# Patient Record
Sex: Female | Born: 1945 | Race: Black or African American | Hispanic: No | Marital: Married | Smoking: Never smoker
Health system: Southern US, Community
[De-identification: ages and names within clinical notes are randomized; demographics above are authoritative.]

## PROBLEM LIST (undated history)

## (undated) DIAGNOSIS — I341 Nonrheumatic mitral (valve) prolapse: Secondary | ICD-10-CM

## (undated) DIAGNOSIS — M21372 Foot drop, left foot: Secondary | ICD-10-CM

## (undated) DIAGNOSIS — I471 Supraventricular tachycardia, unspecified: Secondary | ICD-10-CM

## (undated) DIAGNOSIS — E7800 Pure hypercholesterolemia, unspecified: Secondary | ICD-10-CM

## (undated) DIAGNOSIS — R200 Anesthesia of skin: Secondary | ICD-10-CM

## (undated) DIAGNOSIS — Z98811 Dental restoration status: Secondary | ICD-10-CM

## (undated) HISTORY — PX: THROAT SURGERY: SHX803

## (undated) HISTORY — PX: COLONOSCOPY: SHX174

## (undated) HISTORY — PX: HERNIA REPAIR: SHX51

## (undated) HISTORY — PX: BARTHOLIN GLAND CYST EXCISION: SHX565

---

## 1996-09-28 HISTORY — PX: UTERINE ARTERY EMBOLIZATION: SHX2629

## 2009-08-09 ENCOUNTER — Emergency Department (HOSPITAL_COMMUNITY): Admission: EM | Admit: 2009-08-09 | Discharge: 2009-08-10 | Payer: Self-pay | Admitting: Emergency Medicine

## 2009-08-11 ENCOUNTER — Emergency Department (HOSPITAL_COMMUNITY): Admission: EM | Admit: 2009-08-11 | Discharge: 2009-08-11 | Payer: Self-pay | Admitting: Emergency Medicine

## 2009-08-11 ENCOUNTER — Ambulatory Visit: Payer: Self-pay | Admitting: Vascular Surgery

## 2009-08-11 ENCOUNTER — Encounter (INDEPENDENT_AMBULATORY_CARE_PROVIDER_SITE_OTHER): Payer: Self-pay | Admitting: Emergency Medicine

## 2009-08-18 ENCOUNTER — Encounter: Admission: RE | Admit: 2009-08-18 | Discharge: 2009-08-18 | Payer: Self-pay | Admitting: Orthopedic Surgery

## 2009-10-28 ENCOUNTER — Encounter: Admission: RE | Admit: 2009-10-28 | Discharge: 2010-01-26 | Payer: Self-pay | Admitting: Orthopedic Surgery

## 2010-01-13 ENCOUNTER — Encounter: Admission: RE | Admit: 2010-01-13 | Discharge: 2010-04-13 | Payer: Self-pay | Admitting: Orthopaedic Surgery

## 2010-04-30 ENCOUNTER — Encounter: Admission: RE | Admit: 2010-04-30 | Discharge: 2010-06-14 | Payer: Self-pay | Admitting: Orthopaedic Surgery

## 2010-05-16 IMAGING — CR DG KNEE 1-2V*L*
2 series · 2 of 2 positions shown · non-contrast
Comparison: 08/10/2009

CLINICAL DATA: Previous dislocation.  Swelling.

LEFT KNEE - 1-2 VIEW

[view not recorded (1 of 2)]
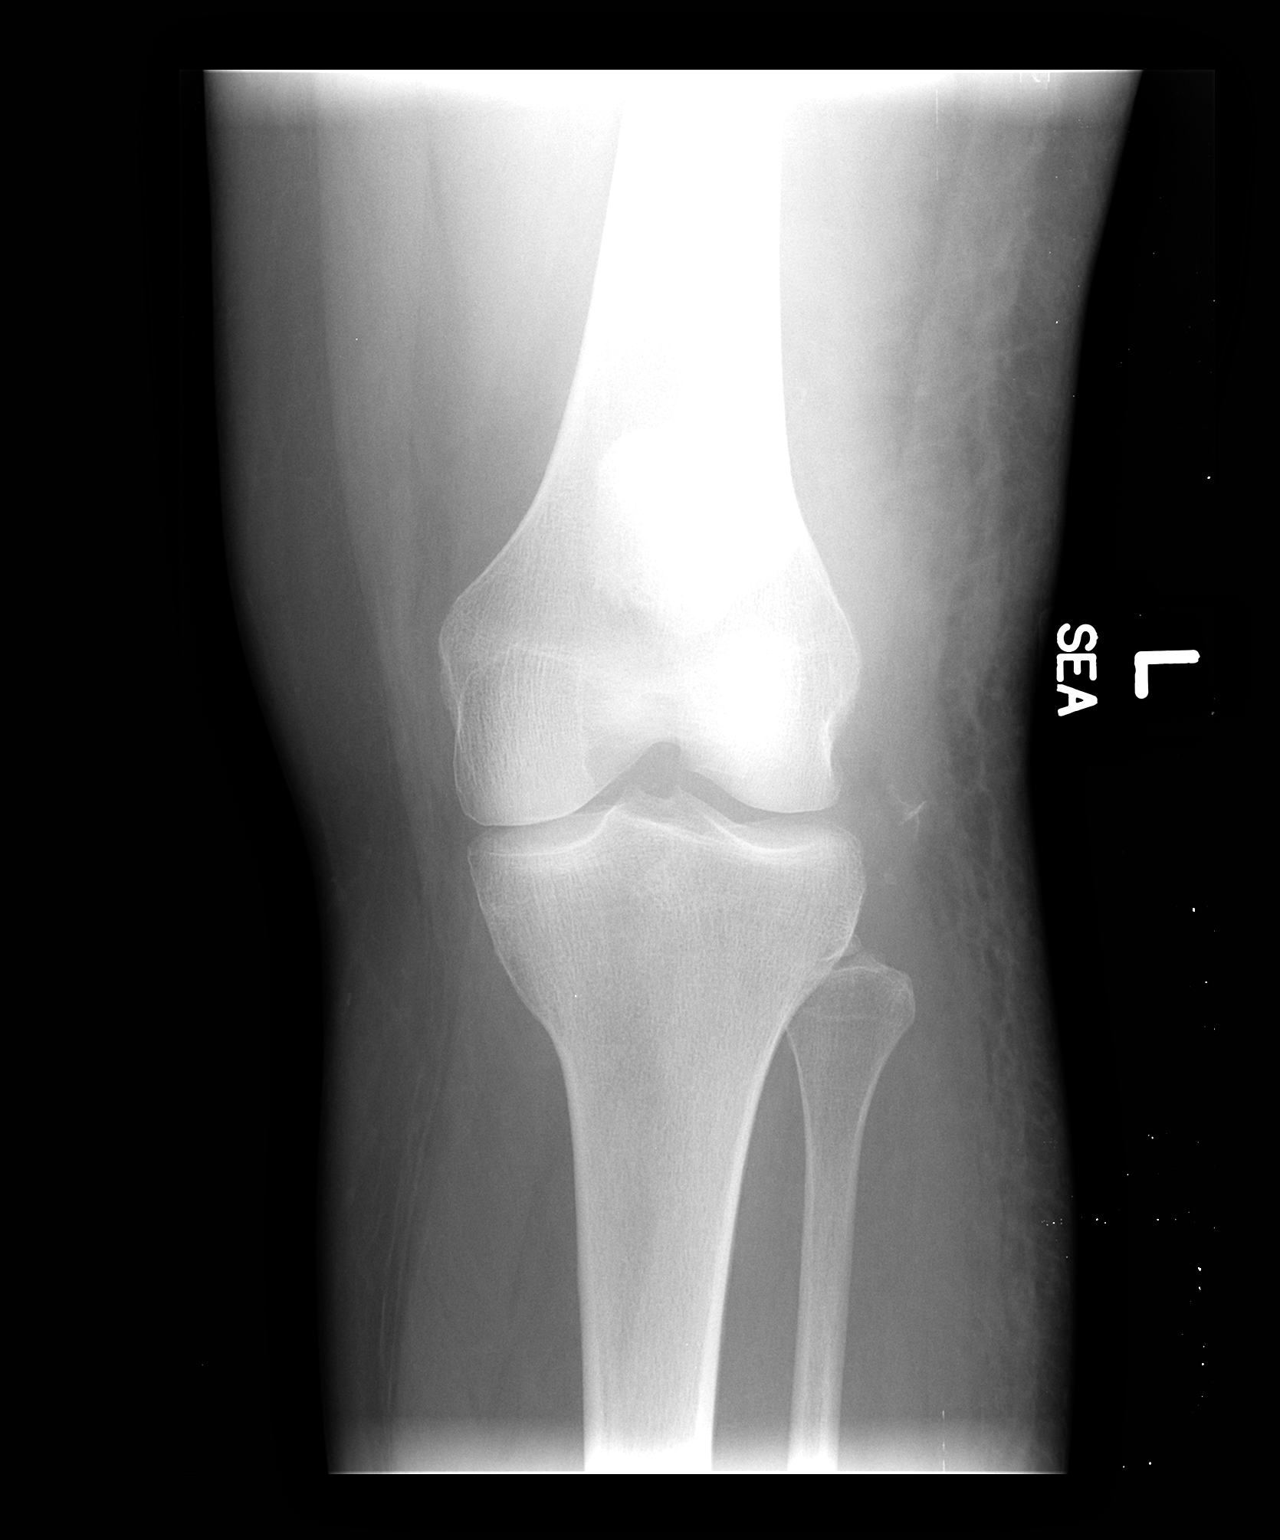

[view not recorded (2 of 2)]
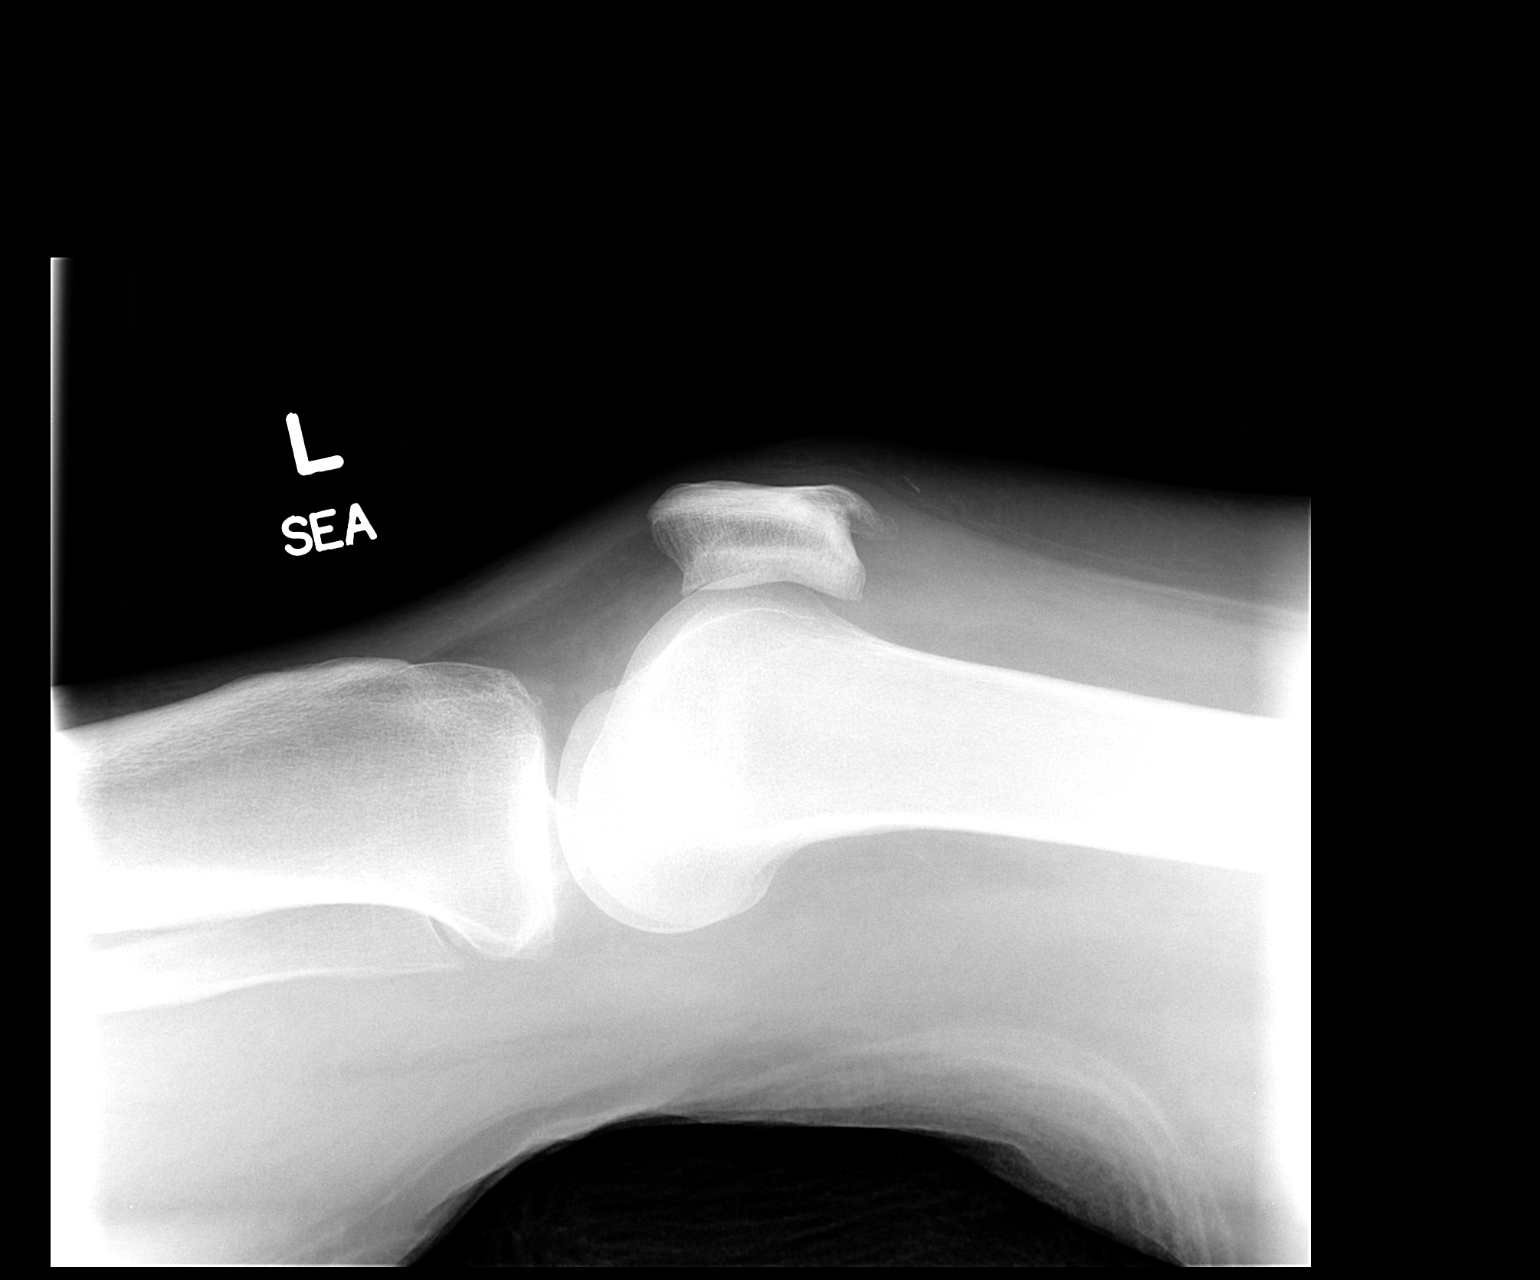

[2 of 2 positions shown; findings below may reference images not displayed]

FINDINGS: There is normal alignment.  No effusion.  Patellar spur
at the insertion of the quadriceps tendon.  Curvilinear small
avulsion fragments project in the soft tissues lateral to the knee
as before.  No new fracture.  Normal mineralization.
IMPRESSION: 1.  Stable appearance since previous films.

## 2010-12-31 LAB — DIFFERENTIAL
Lymphocytes Relative: 36 % (ref 12–46)
Lymphs Abs: 1.9 10*3/uL (ref 0.7–4.0)
Monocytes Absolute: 0.4 10*3/uL (ref 0.1–1.0)
Monocytes Relative: 7 % (ref 3–12)
Neutro Abs: 3 10*3/uL (ref 1.7–7.7)

## 2010-12-31 LAB — POCT I-STAT, CHEM 8
BUN: 9 mg/dL (ref 6–23)
Calcium, Ion: 1.02 mmol/L — ABNORMAL LOW (ref 1.12–1.32)
Chloride: 106 mEq/L (ref 96–112)
Creatinine, Ser: 0.9 mg/dL (ref 0.4–1.2)
Glucose, Bld: 180 mg/dL — ABNORMAL HIGH (ref 70–99)

## 2010-12-31 LAB — CBC
HCT: 36.8 % (ref 36.0–46.0)
Hemoglobin: 12.6 g/dL (ref 12.0–15.0)
RBC: 4.03 MIL/uL (ref 3.87–5.11)

## 2011-07-16 ENCOUNTER — Ambulatory Visit
Payer: Managed Care, Other (non HMO) | Attending: Orthopaedic Surgery | Admitting: Rehabilitative and Restorative Service Providers"

## 2011-07-16 DIAGNOSIS — M25579 Pain in unspecified ankle and joints of unspecified foot: Secondary | ICD-10-CM | POA: Insufficient documentation

## 2011-07-16 DIAGNOSIS — M6281 Muscle weakness (generalized): Secondary | ICD-10-CM | POA: Insufficient documentation

## 2011-07-16 DIAGNOSIS — IMO0001 Reserved for inherently not codable concepts without codable children: Secondary | ICD-10-CM | POA: Insufficient documentation

## 2011-07-22 ENCOUNTER — Ambulatory Visit: Payer: Managed Care, Other (non HMO) | Admitting: Physical Therapy

## 2011-07-27 ENCOUNTER — Encounter: Payer: Self-pay | Admitting: Rehabilitation

## 2011-07-29 ENCOUNTER — Encounter: Payer: Self-pay | Admitting: Physical Therapy

## 2011-07-31 ENCOUNTER — Encounter: Payer: Self-pay | Admitting: Physical Therapy

## 2011-08-03 ENCOUNTER — Encounter: Payer: Self-pay | Admitting: Physical Therapy

## 2011-08-05 ENCOUNTER — Ambulatory Visit: Payer: Managed Care, Other (non HMO) | Attending: Orthopaedic Surgery | Admitting: Physical Therapy

## 2011-08-05 DIAGNOSIS — M6281 Muscle weakness (generalized): Secondary | ICD-10-CM | POA: Insufficient documentation

## 2011-08-05 DIAGNOSIS — IMO0001 Reserved for inherently not codable concepts without codable children: Secondary | ICD-10-CM | POA: Insufficient documentation

## 2011-08-05 DIAGNOSIS — M25579 Pain in unspecified ankle and joints of unspecified foot: Secondary | ICD-10-CM | POA: Insufficient documentation

## 2011-08-07 ENCOUNTER — Encounter: Payer: Self-pay | Admitting: Physical Therapy

## 2011-08-18 ENCOUNTER — Ambulatory Visit: Payer: Managed Care, Other (non HMO) | Admitting: Rehabilitation

## 2011-08-26 ENCOUNTER — Ambulatory Visit: Payer: Managed Care, Other (non HMO) | Admitting: Physical Therapy

## 2011-09-02 ENCOUNTER — Ambulatory Visit: Payer: Managed Care, Other (non HMO) | Attending: Orthopaedic Surgery | Admitting: Physical Therapy

## 2011-09-02 DIAGNOSIS — IMO0001 Reserved for inherently not codable concepts without codable children: Secondary | ICD-10-CM | POA: Insufficient documentation

## 2011-09-02 DIAGNOSIS — M6281 Muscle weakness (generalized): Secondary | ICD-10-CM | POA: Insufficient documentation

## 2011-09-02 DIAGNOSIS — M25579 Pain in unspecified ankle and joints of unspecified foot: Secondary | ICD-10-CM | POA: Insufficient documentation

## 2011-09-09 ENCOUNTER — Ambulatory Visit: Payer: Managed Care, Other (non HMO) | Admitting: Rehabilitation

## 2011-09-28 DIAGNOSIS — E782 Mixed hyperlipidemia: Secondary | ICD-10-CM | POA: Insufficient documentation

## 2012-02-02 DIAGNOSIS — R29818 Other symptoms and signs involving the nervous system: Secondary | ICD-10-CM | POA: Insufficient documentation

## 2012-04-13 ENCOUNTER — Ambulatory Visit: Payer: BC Managed Care – PPO | Attending: Orthopaedic Surgery

## 2012-04-13 ENCOUNTER — Ambulatory Visit: Payer: BC Managed Care – PPO | Admitting: Rehabilitative and Restorative Service Providers"

## 2012-04-13 DIAGNOSIS — M25676 Stiffness of unspecified foot, not elsewhere classified: Secondary | ICD-10-CM | POA: Insufficient documentation

## 2012-04-13 DIAGNOSIS — IMO0001 Reserved for inherently not codable concepts without codable children: Secondary | ICD-10-CM | POA: Insufficient documentation

## 2012-04-13 DIAGNOSIS — M25673 Stiffness of unspecified ankle, not elsewhere classified: Secondary | ICD-10-CM | POA: Insufficient documentation

## 2012-04-13 DIAGNOSIS — M6281 Muscle weakness (generalized): Secondary | ICD-10-CM | POA: Insufficient documentation

## 2012-04-20 ENCOUNTER — Ambulatory Visit: Payer: BC Managed Care – PPO

## 2012-04-27 ENCOUNTER — Ambulatory Visit: Payer: BC Managed Care – PPO | Admitting: Physical Therapy

## 2012-05-04 ENCOUNTER — Ambulatory Visit: Payer: BC Managed Care – PPO | Attending: Orthopaedic Surgery | Admitting: Physical Therapy

## 2012-05-04 DIAGNOSIS — M6281 Muscle weakness (generalized): Secondary | ICD-10-CM | POA: Insufficient documentation

## 2012-05-04 DIAGNOSIS — M25676 Stiffness of unspecified foot, not elsewhere classified: Secondary | ICD-10-CM | POA: Insufficient documentation

## 2012-05-04 DIAGNOSIS — M25673 Stiffness of unspecified ankle, not elsewhere classified: Secondary | ICD-10-CM | POA: Insufficient documentation

## 2012-05-04 DIAGNOSIS — IMO0001 Reserved for inherently not codable concepts without codable children: Secondary | ICD-10-CM | POA: Insufficient documentation

## 2012-05-11 ENCOUNTER — Ambulatory Visit: Payer: BC Managed Care – PPO | Admitting: Physical Therapy

## 2012-05-18 ENCOUNTER — Ambulatory Visit: Payer: BC Managed Care – PPO | Admitting: Physical Therapy

## 2012-05-25 ENCOUNTER — Ambulatory Visit: Payer: BC Managed Care – PPO | Admitting: Physical Therapy

## 2012-06-01 ENCOUNTER — Ambulatory Visit: Payer: BC Managed Care – PPO | Attending: Orthopaedic Surgery | Admitting: Physical Therapy

## 2012-06-01 DIAGNOSIS — IMO0001 Reserved for inherently not codable concepts without codable children: Secondary | ICD-10-CM | POA: Insufficient documentation

## 2012-06-01 DIAGNOSIS — M25676 Stiffness of unspecified foot, not elsewhere classified: Secondary | ICD-10-CM | POA: Insufficient documentation

## 2012-06-01 DIAGNOSIS — M6281 Muscle weakness (generalized): Secondary | ICD-10-CM | POA: Insufficient documentation

## 2012-06-01 DIAGNOSIS — M25673 Stiffness of unspecified ankle, not elsewhere classified: Secondary | ICD-10-CM | POA: Insufficient documentation

## 2012-06-08 ENCOUNTER — Ambulatory Visit: Payer: BC Managed Care – PPO | Admitting: Physical Therapy

## 2013-01-11 DIAGNOSIS — N281 Cyst of kidney, acquired: Secondary | ICD-10-CM | POA: Insufficient documentation

## 2013-02-07 DIAGNOSIS — I471 Supraventricular tachycardia: Secondary | ICD-10-CM | POA: Insufficient documentation

## 2013-07-05 DIAGNOSIS — Z9889 Other specified postprocedural states: Secondary | ICD-10-CM

## 2013-07-05 DIAGNOSIS — Z8719 Personal history of other diseases of the digestive system: Secondary | ICD-10-CM | POA: Insufficient documentation

## 2015-05-02 DIAGNOSIS — I341 Nonrheumatic mitral (valve) prolapse: Secondary | ICD-10-CM | POA: Insufficient documentation

## 2015-10-30 ENCOUNTER — Encounter: Payer: Self-pay | Admitting: *Deleted

## 2015-11-04 NOTE — Discharge Instructions (Signed)
INSTRUCTIONS FOLLOWING OCULOPLASTIC SURGERY °AMY M. FOWLER, MD ° °AFTER YOUR EYE SURGERY, THER ARE MANY THINGS THWIHC YOU, THE PATIENT, CAN DO TO ASSURE THE BEST POSSIBLE RESULT FROM YOUR OPERATION.  THIS SHEET SHOULD BE REFERRED TO WHENEVER QUESTIONS ARISE.  IF THERE ARE ANY QUESTIONS NOT ANSWERED HERE, DO NOT HESITATE TO CALL OUR OFFICE AT [phone] OR [phone].  THERE IS ALWAYS [facility] AVAILABLE TO CALL IF QUESTIONS OR PROBLEMS ARISE. ° °VISION: Your vision may be blurred and out of focus after surgery until you are able to stop using your ointment, swelling resolves and your eye(s) heal. This may take 1 to 2 weeks at the least.  If your vision becomes gradually more dim or dark, this is not normal and you need to call our office immediately. ° °EYE CARE: For the first 48 hours after surgery, use ice packs frequently - “20 minutes on, 20 minutes off” - to help reduce swelling and bruising.  Small bags of frozen peas or corn make good ice packs along with cloths soaked in ice water.  If you are wearing a patch or other type of dressing following surgery, keep this on for the amount of time specified by your doctor.  For the first week following surgery, you will need to treat your stitches with great care.  If is OK to shower, but take care to not allow soapy water to run into your eye(s) to help reduce changes of infection.  You may gently clean the eyelashes and around the eye(s) with cotton balls and sterile water, BUT DO NOT RUB THE STITCHES VIGOROUSLY.  Keeping your stitches moist with ointment will help promote healing with minimal scar formation. ° °ACTIVITY: When you leave the surgery center, you should go home, rest and be inactive.  The eye(s) may feel scratchy and keeping the eyes closed will allow for faster healing.  The first week following surgery, avoid straining (anything making the face turn red) or lifting over 20 pounds.  Additionally, avoid bending which causes your head to go below  your waist.  Using your eyes will NOT harm them, so feel free to read, watch television, use the computer, etc as desired.  Driving depends on each individual, so check with your doctor if you have questions about driving. ° °MEDICATIONS:  You will be given a prescription for an ointment to use 4 times a day on your stitches.  You can use the ointment in your eyes if they feel scratchy or irritated.  If you eyelid(s) don’t close completely when you sleep, put some ointment in your eyes before bedtime. ° °EMERGENCY: If you experience SEVERE EYE PAIN OR HEADACHE UNRELIEVED BY TYLENOL OR PERCOCET, NAUSEA OR VOMITING, WORSENING REDNESS, OR WORSENING VISION (ESPECIALLY VISION THAT WA INITIALLY BETTER) CALL [phone] OR [phone] DURING BUSINESS HOURS OR AFTER HOURS. ° °General Anesthesia, Adult, Care After °Refer to this sheet in the next few weeks. These instructions provide you with information on caring for yourself after your procedure. Your health care provider may also give you more specific instructions. Your treatment has been planned according to current medical practices, but problems sometimes occur. Call your health care provider if you have any problems or questions after your procedure. °WHAT TO EXPECT AFTER THE PROCEDURE °After the procedure, it is typical to experience: °· Sleepiness. °· Nausea and vomiting. °HOME CARE INSTRUCTIONS °· For the first 24 hours after general anesthesia: °¨ Have a responsible person with you. °¨ Do not drive a car. If you   are alone, do not take public transportation. °¨ Do not drink alcohol. °¨ Do not take medicine that has not been prescribed by your health care provider. °¨ Do not sign important papers or make important decisions. °¨ You may resume a normal diet and activities as directed by your health care provider. °· Change bandages (dressings) as directed. °· If you have questions or problems that seem related to general anesthesia, call the hospital and ask for  the anesthetist or anesthesiologist on call. °SEEK MEDICAL CARE IF: °· You have nausea and vomiting that continue the day after anesthesia. °· You develop a rash. °SEEK IMMEDIATE MEDICAL CARE IF:  °· You have difficulty breathing. °· You have chest pain. °· You have any allergic problems. °  °This information is not intended to replace advice given to you by your health care provider. Make sure you discuss any questions you have with your health care provider. °  °Document Released: 12/21/2000 Document Revised: 10/05/2014 Document Reviewed: 01/13/2012 °Elsevier Interactive Patient Education ©2016 [employer] ° °

## 2015-11-05 ENCOUNTER — Ambulatory Visit: Payer: Medicare Other | Admitting: Anesthesiology

## 2015-11-05 ENCOUNTER — Ambulatory Visit
Admission: RE | Admit: 2015-11-05 | Discharge: 2015-11-05 | Disposition: A | Discharge status: Home / Self Care | Payer: Medicare Other | Source: Ambulatory Visit | Attending: Ophthalmology | Admitting: Ophthalmology

## 2015-11-05 ENCOUNTER — Encounter
Admission: RE | Disposition: A | Discharge status: Home / Self Care | Payer: Self-pay | Source: Ambulatory Visit | Attending: Ophthalmology

## 2015-11-05 DIAGNOSIS — H0015 Chalazion left lower eyelid: Secondary | ICD-10-CM | POA: Diagnosis not present

## 2015-11-05 DIAGNOSIS — Z79899 Other long term (current) drug therapy: Secondary | ICD-10-CM | POA: Insufficient documentation

## 2015-11-05 DIAGNOSIS — H02403 Unspecified ptosis of bilateral eyelids: Secondary | ICD-10-CM | POA: Diagnosis not present

## 2015-11-05 DIAGNOSIS — M21372 Foot drop, left foot: Secondary | ICD-10-CM | POA: Diagnosis not present

## 2015-11-05 DIAGNOSIS — H02834 Dermatochalasis of left upper eyelid: Secondary | ICD-10-CM | POA: Insufficient documentation

## 2015-11-05 DIAGNOSIS — R002 Palpitations: Secondary | ICD-10-CM | POA: Insufficient documentation

## 2015-11-05 DIAGNOSIS — K449 Diaphragmatic hernia without obstruction or gangrene: Secondary | ICD-10-CM | POA: Insufficient documentation

## 2015-11-05 DIAGNOSIS — H02831 Dermatochalasis of right upper eyelid: Secondary | ICD-10-CM | POA: Diagnosis not present

## 2015-11-05 DIAGNOSIS — H02839 Dermatochalasis of unspecified eye, unspecified eyelid: Secondary | ICD-10-CM | POA: Diagnosis present

## 2015-11-05 HISTORY — DX: Nonrheumatic mitral (valve) prolapse: I34.1

## 2015-11-05 HISTORY — DX: Anesthesia of skin: R20.0

## 2015-11-05 HISTORY — PX: BROW LIFT: SHX178

## 2015-11-05 HISTORY — PX: CHALAZION EXCISION: SHX213

## 2015-11-05 HISTORY — PX: PTOSIS REPAIR: SHX6568

## 2015-11-05 HISTORY — DX: Pure hypercholesterolemia, unspecified: E78.00

## 2015-11-05 HISTORY — DX: Supraventricular tachycardia: I47.1

## 2015-11-05 HISTORY — DX: Foot drop, left foot: M21.372

## 2015-11-05 HISTORY — DX: Supraventricular tachycardia, unspecified: I47.10

## 2015-11-05 HISTORY — DX: Dental restoration status: Z98.811

## 2015-11-05 SURGERY — BLEPHAROPLASTY
Anesthesia: Monitor Anesthesia Care | Site: Face | Laterality: Bilateral | Wound class: Clean

## 2015-11-05 MED ORDER — PROPOFOL 500 MG/50ML IV EMUL
INTRAVENOUS | Status: DC | PRN
  Administered 2015-11-05: 25 ug/kg/min via INTRAVENOUS

## 2015-11-05 MED ORDER — LACTATED RINGERS IV SOLN: 500.0000 mL | INTRAVENOUS | Status: DC

## 2015-11-05 MED ORDER — OXYCODONE HCL 5 MG/5ML PO SOLN: 5.0000 mg | Freq: Once | ORAL | Status: DC | PRN

## 2015-11-05 MED ORDER — ACETAMINOPHEN 325 MG PO TABS: 325.0000 mg | ORAL_TABLET | ORAL | Status: DC | PRN

## 2015-11-05 MED ORDER — BACITRACIN 500 UNIT/GM OP OINT: TOPICAL_OINTMENT | OPHTHALMIC | Status: DC

## 2015-11-05 MED ORDER — DEXAMETHASONE SODIUM PHOSPHATE 4 MG/ML IJ SOLN: 8.0000 mg | Freq: Once | INTRAMUSCULAR | Status: DC | PRN

## 2015-11-05 MED ORDER — LACTATED RINGERS IV SOLN
INTRAVENOUS | Status: DC
  Administered 2015-11-05: 10:00:00 via INTRAVENOUS

## 2015-11-05 MED ORDER — ACETAMINOPHEN 160 MG/5ML PO SOLN: 325.0000 mg | ORAL | Status: DC | PRN

## 2015-11-05 MED ORDER — FENTANYL CITRATE (PF) 100 MCG/2ML IJ SOLN: 25.0000 ug | INTRAMUSCULAR | Status: DC | PRN

## 2015-11-05 MED ORDER — BUPIVACAINE HCL (PF) 0.5 % IJ SOLN
INTRAMUSCULAR | Status: DC | PRN
  Administered 2015-11-05: 7 mL via OPHTHALMIC

## 2015-11-05 MED ORDER — ALFENTANIL 500 MCG/ML IJ INJ
INJECTION | INTRAMUSCULAR | Status: DC | PRN
  Administered 2015-11-05: 600 ug via INTRAVENOUS
  Administered 2015-11-05 (×2): 200 ug via INTRAVENOUS

## 2015-11-05 MED ORDER — ERYTHROMYCIN 5 MG/GM OP OINT
TOPICAL_OINTMENT | OPHTHALMIC | Status: DC | PRN
  Administered 2015-11-05: 1 via OPHTHALMIC

## 2015-11-05 MED ORDER — OXYCODONE HCL 5 MG PO TABS: 5.0000 mg | ORAL_TABLET | Freq: Once | ORAL | Status: DC | PRN

## 2015-11-05 MED ORDER — NEOMYCIN-POLYMYXIN-DEXAMETH 3.5-10000-0.1 OP SUSP: OPHTHALMIC | Status: DC

## 2015-11-05 MED ORDER — OXYCODONE-ACETAMINOPHEN 5-325 MG PO TABS: 1.0000 | ORAL_TABLET | ORAL | Status: DC | PRN

## 2015-11-05 MED ORDER — TETRACAINE HCL 0.5 % OP SOLN
OPHTHALMIC | Status: DC | PRN
  Administered 2015-11-05: 2 [drp] via OPHTHALMIC

## 2015-11-05 MED ORDER — MIDAZOLAM HCL 2 MG/2ML IJ SOLN
INTRAMUSCULAR | Status: DC | PRN
  Administered 2015-11-05 (×2): 1 mg via INTRAVENOUS

## 2015-11-05 MED ORDER — BSS IO SOLN
INTRAOCULAR | Status: DC | PRN
  Administered 2015-11-05: 5 mL via INTRAOCULAR

## 2015-11-05 SURGICAL SUPPLY — 42 items
APPLICATOR COTTON TIP WD 3 STR (MISCELLANEOUS) ×6 IMPLANT
BANDAGE EYE OVAL (MISCELLANEOUS) ×3 IMPLANT
BLADE SURG 15 STRL LF DISP TIS (BLADE) ×1 IMPLANT
BLADE SURG 15 STRL SS (BLADE) ×2
BLADE SURG SZ11 CARB STEEL (BLADE) ×3 IMPLANT
CAUTERY HITEMP AA01 (MISCELLANEOUS) ×3 IMPLANT
CORD BIP STRL DISP 12FT (MISCELLANEOUS) ×3 IMPLANT
COVER MAYO STAND STRL (DRAPES) ×3 IMPLANT
DRAPE HEAD BAR (DRAPES) ×3 IMPLANT
DRAPE SHEET LG 3/4 BI-LAMINATE (DRAPES) ×3 IMPLANT
GAUZE SPONGE 4X4 12PLY STRL (GAUZE/BANDAGES/DRESSINGS) ×3 IMPLANT
GAUZE SPONGE NON-WVN 2X2 STRL (MISCELLANEOUS) ×10 IMPLANT
GLOVE SURG LX 7.0 MICRO (GLOVE) ×4
GLOVE SURG LX STRL 7.0 MICRO (GLOVE) ×2 IMPLANT
MARKER SKIN XFINE TIP W/RULER (MISCELLANEOUS) ×3 IMPLANT
NEEDLE FILTER BLUNT 18X 1/2SAF (NEEDLE) ×2
NEEDLE FILTER BLUNT 18X1 1/2 (NEEDLE) ×1 IMPLANT
NEEDLE HYPO 30X.5 LL (NEEDLE) ×6 IMPLANT
PACK DRAPE NASAL/ENT (PACKS) ×3 IMPLANT
SOL PREP PVP 2OZ (MISCELLANEOUS) ×3
SOLUTION PREP PVP 2OZ (MISCELLANEOUS) ×1 IMPLANT
SPONGE VERSALON 2X2 STRL (MISCELLANEOUS) ×20
SUT CHROMIC 4-0 (SUTURE)
SUT CHROMIC 4-0 M2 12X2 ARM (SUTURE)
SUT CHROMIC 5 0 P 3 (SUTURE) IMPLANT
SUT ETHILON 4 0 CL P 3 (SUTURE) IMPLANT
SUT MERSILENE 4-0 S-2 (SUTURE) IMPLANT
SUT PDS AB 4-0 P3 18 (SUTURE) IMPLANT
SUT PLAIN GUT (SUTURE) ×6 IMPLANT
SUT PROLENE 5 0 P 3 (SUTURE) ×3 IMPLANT
SUT PROLENE 6 0 P 1 18 (SUTURE) ×6 IMPLANT
SUT SILK 4 0 G 3 (SUTURE) ×3 IMPLANT
SUT VIC AB 5-0 P-3 18X BRD (SUTURE) IMPLANT
SUT VIC AB 5-0 P3 18 (SUTURE)
SUT VICRYL 6-0  S14 CTD (SUTURE)
SUT VICRYL 6-0 S14 CTD (SUTURE) IMPLANT
SUT VICRYL 7 0 TG140 8 (SUTURE) IMPLANT
SUTURE CHRMC 4-0 M2 12X2 ARM (SUTURE) IMPLANT
SYR 3ML LL SCALE MARK (SYRINGE) ×3 IMPLANT
SYRINGE 10CC LL (SYRINGE) ×3 IMPLANT
TOWEL OR 17X26 4PK STRL BLUE (TOWEL DISPOSABLE) ×3 IMPLANT
WATER STERILE IRR 500ML POUR (IV SOLUTION) ×3 IMPLANT

## 2015-11-05 NOTE — Anesthesia Procedure Notes (Signed)
Procedure Name: MAC Date/Time: 11/05/2015 10:51 AM Performed by: Cameron Ali Pre-anesthesia Checklist: Patient identified, Emergency Drugs available, Suction available, Timeout performed and Patient being monitored Patient Re-evaluated:Patient Re-evaluated prior to inductionOxygen Delivery Method: Nasal cannula Placement Confirmation: positive ETCO2

## 2015-11-05 NOTE — Anesthesia Postprocedure Evaluation (Signed)
Anesthesia Post Note  Patient: Amy Richards  Procedure(s) Performed: Procedure(s) (LRB): BLEPHAROPLASTY UPPER WITH EXCESS SKIN AND LOWER COSMETIC (Bilateral) EXCISION CHALAZION LEFT UPPER LID (Bilateral) PTOSIS REPAIR (Bilateral)  Patient location during evaluation: PACU Anesthesia Type: MAC Level of consciousness: awake and alert Pain management: pain level controlled Vital Signs Assessment: post-procedure vital signs reviewed and stable Respiratory status: spontaneous breathing, nonlabored ventilation and respiratory function stable Cardiovascular status: blood pressure returned to baseline and stable Postop Assessment: no signs of nausea or vomiting Anesthetic complications: no    [name] D [name]

## 2015-11-05 NOTE — Transfer of Care (Signed)
Immediate Anesthesia Transfer of Care Note  Patient: Amy Richards  Procedure(s) Performed: Procedure(s): BLEPHAROPLASTY UPPER WITH EXCESS SKIN AND LOWER COSMETIC (Bilateral) EXCISION CHALAZION LEFT UPPER LID (Bilateral) PTOSIS REPAIR (Bilateral)  Patient Location: PACU  Anesthesia Type: No value filed.  Level of Consciousness: awake, alert  and patient cooperative  Airway and Oxygen Therapy: Patient Spontanous Breathing and Patient connected to supplemental oxygen  Post-op Assessment: Post-op Vital signs reviewed, Patient's Cardiovascular Status Stable, Respiratory Function Stable, Patent Airway and No signs of Nausea or vomiting  Post-op Vital Signs: Reviewed and stable  Complications: No apparent anesthesia complications

## 2015-11-05 NOTE — Interval H&P Note (Signed)
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 After consideration of risks, benefits and other options for treatment, the patient has consented to  Procedure(s): BLEPHAROPLASTY UPPER WITH EXCESS SKIN AND LOWER COSMETIC (Bilateral) EXCISION CHALAZION LEFT UPPER LID (Bilateral) PTOSIS REPAIR (Bilateral) as a surgical intervention .  The patient's history has been reviewed, patient examined, no change in status, stable for surgery.  I have reviewed the patient's chart and labs.  Questions were answered to the patient's satisfaction.     Vickki Muff, Tabatha Razzano M

## 2015-11-05 NOTE — H&P (Signed)
  See the history and physical completed at [facility] on 10/23/2015 and scanned into the chart

## 2015-11-05 NOTE — Anesthesia Preprocedure Evaluation (Signed)
Anesthesia Evaluation  Patient identified by MRN, date of birth, ID band Patient awake    Reviewed: Allergy & Precautions, H&P , NPO status , Patient's Chart, lab work & pertinent test results, reviewed documented beta blocker date and time   Airway Mallampati: II  TM Distance: >3 FB Neck ROM: full    Dental no notable dental hx.    Pulmonary neg pulmonary ROS,    Pulmonary exam normal breath sounds clear to auscultation       Cardiovascular Exercise Tolerance: Good + dysrhythmias Supra Ventricular Tachycardia  Rhythm:regular Rate:Normal     Neuro/Psych negative neurological ROS  negative psych ROS   GI/Hepatic negative GI ROS, Neg liver ROS,   Endo/Other  negative endocrine ROS  Renal/GU negative Renal ROS  negative genitourinary   Musculoskeletal   Abdominal   Peds  Hematology negative hematology ROS (+)   Anesthesia Other Findings   Reproductive/Obstetrics negative OB ROS                             Anesthesia Physical Anesthesia Plan  ASA: II  Anesthesia Plan: MAC   Post-op Pain Management:    Induction:   Airway Management Planned:   Additional Equipment:   Intra-op Plan:   Post-operative Plan:   Informed Consent: I have reviewed the patients History and Physical, chart, labs and discussed the procedure including the risks, benefits and alternatives for the proposed anesthesia with the patient or authorized representative who has indicated his/her understanding and acceptance.     Plan Discussed with: CRNA  Anesthesia Plan Comments:         Anesthesia Quick Evaluation

## 2015-11-05 NOTE — Op Note (Signed)
Preoperative Diagnosis:  1. Visually significant dermatochalasis both Upper Eyelid(s) 2. Visually significant blepharoptosis both upper eyelids 3.  Steatoblephron both Lower Eyelid(s) 4.  Chalazion left upper eyelid   Postoperative Diagnosis:  Same.  Procedure(s) Performed:   1. Upper eyelid blepharoplasty with excess skin excision  both Upper Eyelid(s) 2. Blepharoptosis repair with levator aponeurosis advancement both eyelids 3.  Cosmetic transconjunctival lower eyelid blepharoplasty both  Lower Eyelid(s) 4. Chalazion excision left upper eyelid   Teaching Surgeon: Philis Pique. Vickki Muff, M.D.  Assistants: none  Anesthesia: IVA  Specimens: None.  Estimated Blood Loss: Minimal.  Complications: None.  Operative Findings: None Dictated  Procedure:   After the risks, benefits, complications and alternatives were discussed with the patient, appropriate informed consent was obtained and the patient was brought to the operating suite. The patient was reclined supine and a timeout was conducted.  Local anesthetic consisting of a 50-50 mixture of 2% lidocaine with epinephrine and 0.75% bupivacaine with added Hylenex was injected subcutaneously to both upper and lower eyelid(s). Additional anesthetic was injected subconjunctivally to both lower eyelids.  After adequate local was instilled, the patient was prepped and draped in the usual sterile fashion for eyelid surgery.   Attention was turned to the upper eyelids. A 40mm upper eyelid crease incision line was marked with calipers on both upper eyelid(s).  A pinch test was used to estimate the amount of excess skin to remove and this was marked in standard blepharoplasty style fashion. Attention was turned to the  right upper eyelid. A #15 blade was used to open the premarked incision line. A skin only flap  was excised and hemostasis was obtained with bipolar cautery.   A buttonhole was created medially and reticularis orbital septum to reveal the  medial fat pockets. These were dissected free from fascial attachments, allowed to prolapse anteriorly, cauterized towards the pedicle base and excised.   Westcott scissors were then used to transect through orbicularis down to the tarsal plate. Epitarsus was dissected to create a smooth surface to suture to. Dissection was then carried superiorly in the plane between orbicularis and orbital septum. Once the preaponeurotic fat pocket was identified, the orbital septum was opened. This revealed the levator and its aponeurosis.    Attention was then turned to the opposite eyelid where the same procedure was performed in the same manner. Hemostasis was obtained with bipolar cautery throughout.   3 interrupted 6-0 Prolene sutures were then passed partial thickness through the tarsal plates both upper eyelid(s). These sutures were placed in line with the mid pupillary, medial limbal, and lateral limbal lines. The sutures were fixed to the levator aponeurosis and adjusted until a nice lid height and contour were achieved. All incisions were then closed with a combination of running and interrupted 6-0 fast absorbing plain suture.   Attention was turned to the right lower eyelid.  A 4.0 silk traction suture was placed through the lid margin and the lid was everted over a desmarres retractor.  A #15 blade was used to make an incision through conjunctiva along the inferior margin of the tarsus.  Hemostasis was obtained with bipolar cautery.  Westcott scissors were used to dis-insert the retractors from the tarsus and both conjunctiva and retractors were allowed to recess inferiorly.   Stephens scissors were used to bluntly dissect between orbicularis and orbital septum down to the inferior orbital rim.  The orbital septum was opened over the medial, central, and lateral fat pockets. These were allowed to prolapse anteriorly,  cauterized towards the pedicle base, and excised. This provided nice flattening of the lower  lid contour.  Attention was then turned to the opposite lid where the same procedure was performed in the same fashion.  The patient tolerated the procedure well.  Erythromycin ophthalmicment was applied to Her incision sites, followed by ice packs. She was taken to the recovery area where she recovered without difficulty.  Post-Op Plan/Instructions:  The patient was instructed to use ice packs frequently for the next 48 hours. She was instructed to use  last tracing ophthalmic  ointment on her incisions 4 times a day  and Maxitrol drops 3 times a day in both eyes for the next 12 to 14 days. She was given a prescription for Percocet for pain control should Tylenol not be effective. She was asked to to follow up in 2 weeks' time or sooner as needed for problems.  Teaching Surgeon Attestation: none  Samiyyah Moffa M. Vickki Muff, M.D.

## 2015-11-06 ENCOUNTER — Encounter: Payer: Self-pay | Admitting: Ophthalmology

## 2015-11-20 DIAGNOSIS — Z800 Family history of malignant neoplasm of digestive organs: Secondary | ICD-10-CM | POA: Insufficient documentation

## 2017-07-09 DIAGNOSIS — R7301 Impaired fasting glucose: Secondary | ICD-10-CM | POA: Insufficient documentation

## 2018-11-10 DIAGNOSIS — M25561 Pain in right knee: Secondary | ICD-10-CM | POA: Insufficient documentation

## 2018-11-10 DIAGNOSIS — M25562 Pain in left knee: Secondary | ICD-10-CM | POA: Insufficient documentation

## 2018-11-14 ENCOUNTER — Ambulatory Visit: Payer: Medicare HMO | Admitting: Podiatry

## 2018-11-14 ENCOUNTER — Other Ambulatory Visit: Payer: Self-pay

## 2018-11-14 VITALS — BP 137/78 | HR 58

## 2018-11-14 DIAGNOSIS — M21372 Foot drop, left foot: Secondary | ICD-10-CM

## 2018-11-14 DIAGNOSIS — M79674 Pain in right toe(s): Secondary | ICD-10-CM

## 2018-11-14 DIAGNOSIS — B351 Tinea unguium: Secondary | ICD-10-CM

## 2018-11-14 DIAGNOSIS — G5792 Unspecified mononeuropathy of left lower limb: Secondary | ICD-10-CM

## 2018-11-14 DIAGNOSIS — L84 Corns and callosities: Secondary | ICD-10-CM

## 2018-11-14 DIAGNOSIS — M79675 Pain in left toe(s): Secondary | ICD-10-CM | POA: Diagnosis not present

## 2018-11-14 NOTE — Patient Instructions (Signed)
Onychomycosis/Fungal Toenails  WHAT IS IT? An infection that lies within the keratin of your nail plate that is caused by a fungus.  WHY ME? Fungal infections affect all ages, sexes, races, and creeds.  There may be many factors that predispose you to a fungal infection such as age, coexisting medical conditions such as diabetes, or an autoimmune disease; stress, medications, fatigue, genetics, etc.  Bottom line: fungus thrives in a warm, moist environment and your shoes offer such a location.  IS IT CONTAGIOUS? Theoretically, yes.  You do not want to share shoes, nail clippers or files with someone who has fungal toenails.  Walking around barefoot in the same room or sleeping in the same bed is unlikely to transfer the organism.  It is important to realize, however, that fungus can spread easily from one nail to the next on the same foot.  HOW DO WE TREAT THIS?  There are several ways to treat this condition.  Treatment may depend on many factors such as age, medications, pregnancy, liver and kidney conditions, etc.  It is best to ask your doctor which options are available to you.  1. No treatment.   Unlike many other medical concerns, you can live with this condition.  However for many people this can be a painful condition and may lead to ingrown toenails or a bacterial infection.  It is recommended that you keep the nails cut short to help reduce the amount of fungal nail. 2. Topical treatment.  These range from herbal remedies to prescription strength nail lacquers.  About 40-50% effective, topicals require twice daily application for approximately 9 to 12 months or until an entirely new nail has grown out.  The most effective topicals are medical grade medications available through physicians offices. 3. Oral antifungal medications.  With an 80-90% cure rate, the most common oral medication requires 3 to 4 months of therapy and stays in your system for a year as the new nail grows out.  Oral  antifungal medications do require blood work to make sure it is a safe drug for you.  A liver function panel will be performed prior to starting the medication and after the first month of treatment.  It is important to have the blood work performed to avoid any harmful side effects.  In general, this medication safe but blood work is required. 4. Laser Therapy.  This treatment is performed by applying a specialized laser to the affected nail plate.  This therapy is noninvasive, fast, and non-painful.  It is not covered by insurance and is therefore, out of pocket.  The results have been very good with a 80-95% cure rate.  [facility] is the only practice in the area to offer this therapy. Permanent Nail Avulsion.  Removing the entire nail so that a new nail will not grow back.Corns and Calluses Corns are small areas of thickened skin that occur on the top, sides, or tip of a toe. They contain a cone-shaped core with a point that can press on a nerve below. This causes pain.  Calluses are areas of thickened skin that can occur anywhere on the body, including the hands, fingers, palms, soles of the feet, and heels. Calluses are usually larger than corns. What are the causes? Corns and calluses are caused by rubbing (friction) or pressure, such as from shoes that are too tight or do not fit properly. What increases the risk? Corns are more likely to develop in people who have misshapen   toes (toe deformities), such as hammer toes. Calluses can occur with friction to any area of the skin. They are more likely to develop in people who:  Work with their hands.  Wear shoes that fit poorly, are too tight, or are high-heeled.  Have toe deformities. What are the signs or symptoms? Symptoms of a corn or callus include:  A hard growth on the skin.  Pain or tenderness under the skin.  Redness and swelling.  Increased discomfort while wearing tight-fitting shoes, if your feet are affected. If a  corn or callus becomes infected, symptoms may include:  Redness and swelling that gets worse.  Pain.  Fluid, blood, or pus draining from the corn or callus. How is this diagnosed? Corns and calluses may be diagnosed based on your symptoms, your medical history, and a physical exam. How is this treated? Treatment for corns and calluses may include:  Removing the cause of the friction or pressure. This may involve: ? Changing your shoes. ? Wearing shoe inserts (orthotics) or other protective layers in your shoes, such as a corn pad. ? Wearing gloves.  Applying medicine to the skin (topical medicine) to help soften skin in the hardened, thickened areas.  Removing layers of dead skin with a file to reduce the size of the corn or callus.  Removing the corn or callus with a scalpel or laser.  Taking antibiotic medicines, if your corn or callus is infected.  Having surgery, if a toe deformity is the cause. Follow these instructions at home:   Take over-the-counter and prescription medicines only as told by your health care provider.  If you were prescribed an antibiotic, take it as told by your health care provider. Do not stop taking it even if your condition starts to improve.  Wear shoes that fit well. Avoid wearing high-heeled shoes and shoes that are too tight or too loose.  Wear any padding, protective layers, gloves, or orthotics as told by your health care provider.  Soak your hands or feet and then use a file or pumice stone to soften your corn or callus. Do this as told by your health care provider.  Check your corn or callus every day for symptoms of infection. Contact a health care provider if you:  Notice that your symptoms do not improve with treatment.  Have redness or swelling that gets worse.  Notice that your corn or callus becomes painful.  Have fluid, blood, or pus coming from your corn or callus.  Have new symptoms. Summary  Corns are small areas of  thickened skin that occur on the top, sides, or tip of a toe.  Calluses are areas of thickened skin that can occur anywhere on the body, including the hands, fingers, palms, and soles of the feet. Calluses are usually larger than corns.  Corns and calluses are caused by rubbing (friction) or pressure, such as from shoes that are too tight or do not fit properly.  Treatment may include wearing any padding, protective layers, gloves, or orthotics as told by your health care provider. This information is not intended to replace advice given to you by your health care provider. Make sure you discuss any questions you have with your health care provider. Document Released: 06/20/2004 Document Revised: 07/28/2017 Document Reviewed: 07/28/2017 Elsevier Interactive Patient Education  2019 [employer]  

## 2018-11-17 DIAGNOSIS — M79672 Pain in left foot: Secondary | ICD-10-CM | POA: Insufficient documentation

## 2018-11-17 DIAGNOSIS — M179 Osteoarthritis of knee, unspecified: Secondary | ICD-10-CM | POA: Insufficient documentation

## 2018-11-17 DIAGNOSIS — M1712 Unilateral primary osteoarthritis, left knee: Secondary | ICD-10-CM | POA: Insufficient documentation

## 2018-11-17 DIAGNOSIS — M1710 Unilateral primary osteoarthritis, unspecified knee: Secondary | ICD-10-CM | POA: Insufficient documentation

## 2018-11-21 ENCOUNTER — Encounter: Payer: Self-pay | Admitting: Podiatry

## 2018-11-21 NOTE — Progress Notes (Signed)
Subjective: Amy Richards presents today referred by Bartholome Bill, MD with cc of painful, discolored, thick toenails and calluses for the past few months which interfere with daily activities.  Duration is longstanding.Pain is aggravated when wearing enclosed shoe gear. Pain is relieved with periodic professional debridement. She has had routine professional care in the past.   Patient states she has drop foot secondary to fall down a flight of stairs in 2010. She relates this happened years ago and she has numbness/weakness left foot. She wears brace at night for this, but no AFO daily.  Past Medical History:  Diagnosis Date  . Dental crowns present    right upper  . Foot drop, left    wears brace at night  . Hypercholesteremia   . Left leg numbness    s/p accident 2010, numb from knee to foot  . MVP (mitral valve prolapse)    palpatations  . PSVT (paroxysmal supraventricular tachycardia) [facility])     Patient Active Problem List   Diagnosis Date Noted  . Impaired fasting glucose 07/09/2017  . Family history of colon cancer 11/20/2015  . Mitral valve prolapse 05/02/2015  . S/P repair of ventral hernia 07/05/2013  . Supraventricular tachycardia ([place]) 02/07/2013  . Simple renal cyst 01/11/2013  . Other symptoms involving nervous and musculoskeletal systems(781.99) 02/02/2012  . Mixed hyperlipidemia 09/28/2011    Past Surgical History:  Procedure Laterality Date  . BARTHOLIN GLAND CYST EXCISION    . BROW LIFT Bilateral 11/05/2015   Procedure: BLEPHAROPLASTY UPPER WITH EXCESS SKIN AND LOWER COSMETIC;  Surgeon: Karle Starch, MD;  Location: [place];  Service: Ophthalmology;  Laterality: Bilateral;  . CHALAZION EXCISION Bilateral 11/05/2015   Procedure: EXCISION CHALAZION LEFT UPPER LID;  Surgeon: Karle Starch, MD;  Location: [place];  Service: Ophthalmology;  Laterality: Bilateral;  . COLONOSCOPY    . HERNIA REPAIR     hiatel hernia  . PTOSIS REPAIR  Bilateral 11/05/2015   Procedure: PTOSIS REPAIR;  Surgeon: Karle Starch, MD;  Location: [place];  Service: Ophthalmology;  Laterality: Bilateral;  . THROAT SURGERY     excision of nodules from vocal cords  . UTERINE ARTERY EMBOLIZATION  1998     Current Outpatient Medications:  .  aspirin EC 81 MG tablet, Take by mouth., Disp: , Rfl:  .  atenolol (TENORMIN) 25 MG tablet, Take 25 mg by mouth daily. PM, Disp: , Rfl:  .  atorvastatin (LIPITOR) 10 MG tablet, Take 10 mg by mouth daily. PM, Disp: , Rfl:  .  bacitracin ophthalmic ointment, Use on sutures 4 times a day for 12-14 days, Disp: 3.5 g, Rfl: 3 .  Cholecalciferol (VITAMIN D-3 PO), Take by mouth daily., Disp: , Rfl:  .  Coenzyme Q10 (CO Q10 PO), Take by mouth daily., Disp: , Rfl:  .  Cranberry 500 MG CAPS, Take by mouth., Disp: , Rfl:  .  loratadine (CLARITIN) 10 MG tablet, Take by mouth., Disp: , Rfl:  .  neomycin-polymyxin b-dexamethasone (MAXITROL) 3.5-10000-0.1 SUSP, Use 1 drop in operative eye 3 times a day for 2 weeks, Disp: 1 Bottle, Rfl: 0 .  Omega 3-6-9 CAPS, Take by mouth daily., Disp: , Rfl:  .  Omega-3 1000 MG CAPS, Take by mouth., Disp: , Rfl:  .  oxyCODONE-acetaminophen (PERCOCET) 5-325 MG tablet, Take 1 tablet by mouth every 4 (four) hours as needed for severe pain., Disp: 6 tablet, Rfl: 0 .  pravastatin (PRAVACHOL) 20 MG tablet, Take  by mouth., Disp: , Rfl:  .  Probiotic Product (PROBIOTIC DAILY PO), Take by mouth., Disp: , Rfl:  .  Red Yeast Rice 600 MG TABS, Take by mouth., Disp: , Rfl:  .  TURMERIC PO, Take by mouth daily., Disp: , Rfl:   No Known Allergies  Social History   Occupational History  . Not on file  Tobacco Use  . Smoking status: Never Smoker  Substance and Sexual Activity  . Alcohol use: Yes    Alcohol/week: 3.0 standard drinks    Types: 3 Glasses of wine per week  . Drug use: Not on file  . Sexual activity: Not on file    No family history on file.   There is no immunization  history on file for this patient.   Review of systems: Positive Findings in bold print.  Constitutional:  chills, fatigue, fever, sweats, weight change Communication: Optometrist, sign Ecologist, hand writing, iPad/Android device Head: headaches, head injury Eyes: changes in vision, eye pain, glaucoma, cataracts, macular degeneration, diplopia, glare,  light sensitivity, eyeglasses or contacts, blindness Ears nose mouth throat: Hard of hearing, ringing in ears, deaf, sign language,  vertigo,   nosebleeds,  rhinitis,  cold sores, snoring, swollen glands Cardiovascular: HTN, edema, arrhythmia, pacemaker in place, defibrillator in place,  chest pain/tightness, chronic anticoagulation, blood clot, heart failure Peripheral Vascular: leg cramps, varicose veins, blood clots, lymphedema Respiratory:  difficulty breathing, denies congestion, SOB, wheezing, cough, emphysema Gastrointestinal: change in appetite or weight, abdominal pain, constipation, diarrhea, nausea, vomiting, vomiting blood, change in bowel habits, abdominal pain, jaundice, rectal bleeding, hemorrhoids, Genitourinary:  nocturia,  pain on urination,  blood in urine, Foley catheter, urinary urgency Musculoskeletal: uses mobility aid,  cramping, stiff joints, painful joints, decreased joint motion, fractures, OA, gout Skin: +changes in toenails, color change, dryness, itching, mole changes,  rash  Neurological: headaches, numbness in left foot, paresthesias in feet, burning in feet, fainting,  seizures, change in speech. denies headaches, memory problems/poor historian, cerebral palsy,  Weakness left foot, paralysis Endocrine: diabetes, hypothyroidism, hyperthyroidism,  goiter, dry mouth, flushing, heat intolerance,  cold intolerance,  excessive thirst, denies polyuria,  nocturia Hematological:  easy bleeding, excessive bleeding, easy bruising, enlarged lymph nodes, on long term blood thinner, history of past  transusions Allergy/immunological:  hives, eczema, frequent infections, multiple drug allergies, seasonal allergies, transplant recipient Psychiatric:  anxiety, depression, mood disorder, suicidal ideations, hallucinations   Objective: Vascular Examination: Capillary refill time immediate x 10 digits Dorsalis pedis and posterior tibial pulses present b/l Sparse digital hair x 10 digits Skin temperature gradient WNL b/l  Dermatological Examination: Skin with normal turgor, texture and tone b/l  Toenails 1-5 b/l discolored, thick, dystrophic with subungual debris and pain with palpation to nailbeds due to thickness of nails.  Hyperkeratotic lesion submet head 2 b/l, dorsal right 2nd PIPJ, dorsal left 5th digit PIPJ, lateral aspect right 2nd digit  Musculoskeletal: Muscle strength 5/5 to all LE muscle groups RLE  Dorsiflexion left foot 2/5  Neurological: Diminished sensation dorsal forefoot left foot  Vibratory sensation intact.  Assessment: 1. Painful onychomycosis toenails 1-5 b/l  2. Calluses submet head 2nd b/l 3. Corns dorsal right 2nd PIPJ, dorsal left 5th digit PIPJ, lateral aspect right 2nd digit 4. Neuropathy left foot 5. Drop foot left LE  Plan: 1. Discussed examination/diagnoses on today. 2. Toenails 1-5 b/l were debrided in length and girth without iatrogenic bleeding. 3. Corns/calluses pared with sterile scalpel blade submet head 2nd b/l, dorsal right 2nd PIPJ, dorsal left  5th digit PIPJ, lateral aspect right 2nd digit 4. Continue brace nightly for foot drop. 5. Patient to continue soft, supportive shoe gear. 6. Patient to report any pedal injuries to medical professional immediately. 7. Follow up 9 weeks. 8. Patient/POA to call should there be a concern in the interim.

## 2019-01-17 ENCOUNTER — Ambulatory Visit: Payer: Medicare HMO | Admitting: Podiatry

## 2019-01-17 ENCOUNTER — Encounter: Payer: Self-pay | Admitting: Podiatry

## 2019-01-17 ENCOUNTER — Other Ambulatory Visit: Payer: Self-pay

## 2019-01-17 VITALS — Temp 97.7°F

## 2019-01-17 DIAGNOSIS — M21372 Foot drop, left foot: Secondary | ICD-10-CM

## 2019-01-17 DIAGNOSIS — M79674 Pain in right toe(s): Secondary | ICD-10-CM | POA: Diagnosis not present

## 2019-01-17 DIAGNOSIS — B353 Tinea pedis: Secondary | ICD-10-CM | POA: Diagnosis not present

## 2019-01-17 DIAGNOSIS — B351 Tinea unguium: Secondary | ICD-10-CM | POA: Diagnosis not present

## 2019-01-17 DIAGNOSIS — M79675 Pain in left toe(s): Secondary | ICD-10-CM | POA: Diagnosis not present

## 2019-01-17 DIAGNOSIS — L84 Corns and callosities: Secondary | ICD-10-CM

## 2019-01-17 DIAGNOSIS — G5792 Unspecified mononeuropathy of left lower limb: Secondary | ICD-10-CM

## 2019-01-17 MED ORDER — CICLOPIROX 0.77 % EX GEL: CUTANEOUS | 0 refills | Status: DC

## 2019-01-17 NOTE — Progress Notes (Signed)
Subjective: Amy Richards presents today with history of peripheral neuropathy with chronic corns/callosities and  painful, mycotic toenails.  Pain is aggravated when wearing enclosed shoe gear and relieved with periodic professional debridement.  Patient has subjective symptoms of neuropathy described as numbness.  Bartholome Bill, MD is her PCP and last visit was    No Known Allergies  Objective: Vitals:   01/17/19 1036  Temp: 97.7 F (36.5 C)   Vascular Examination: Capillary refill time immediate x 10 digits.  Dorsalis pedis and Posterior tibial pulses present b/l.  Digital hair sparse x 10 digits was absent.  Skin temperature gradient WNL b/l.  Dermatological Examination: Skin with normal turgor, texture and tone b/l.  Toenails 1-5 b/l discolored, thick, dystrophic with subungual debris and pain with palpation to nailbeds due to thickness of nails.  Hyperkeratotic lesion submet head 2 b/l, dorsal right 2nd PIPJ, medial DIPJ right 2nd digit, dorsal left 5th PIPJ with tenderness to palpation. No edema, no erythema, no drainage, no flocculence.  Interdigital maceration 3rd, 4th webspace left foot.  No erythema, no edema, no drainage, no breaks in skin.  Musculoskeletal: Muscle strength 5/5 to all muscle groups RLE.  Dorsiflexion 2/5 left foot  Neurological: Sensation with 10 gram monofilament is absent left foot..  Assessment: 1. Painful onychomycosis toenails 1-5 b/l 2. Calluses submet head 2nd b/l 3. Corns dorsal right 2nd PIPJ, medial DIPJ right 2nd digit, dorsal left 5th PIPJ  4. Tinea pedis 5. Neuropathy left foot  Plan: 1.  Toenails 1-5 b/l were debrided in length and girth without iatrogenic bleeding. Rx written for nonformulary compounding topical antifungal: [facility]: Antifungal cream - Terbinafine 3%, Fluconazole 2%, Tea Tree Oil 5%, Urea 10%, Ibuprofen 2% in DMSO Suspension #22ml. Apply to the affected nail(s) at bedtime.Rx for  Ciclopirox Gel 0.77% sent to pharmacy. Patient is to apply to both feet and between toes bid x 4 weeks. 2.  Calluses pared submetatarsal head(s) 2 b/l utilizing sterile scalpel blade without incident. Corn(s) pared dorsal right 2nd PIPJ, medial DIPJ right 2nd digit, dorsal left 5th PIPJ utilizing sterile scalpel blade without incident. 3.   Patient to continue soft, supportive shoe gear 4.   Patient to report any pedal injuries to medical professional  5.   Follow up 9 weeks.  6.  Patient/POA to call should there be a concern in the interim.

## 2019-01-17 NOTE — Patient Instructions (Signed)
Onychomycosis/Fungal Toenails  WHAT IS IT? An infection that lies within the keratin of your nail plate that is caused by a fungus.  WHY ME? Fungal infections affect all ages, sexes, races, and creeds.  There may be many factors that predispose you to a fungal infection such as age, coexisting medical conditions such as diabetes, or an autoimmune disease; stress, medications, fatigue, genetics, etc.  Bottom line: fungus thrives in a warm, moist environment and your shoes offer such a location.  IS IT CONTAGIOUS? Theoretically, yes.  You do not want to share shoes, nail clippers or files with someone who has fungal toenails.  Walking around barefoot in the same room or sleeping in the same bed is unlikely to transfer the organism.  It is important to realize, however, that fungus can spread easily from one nail to the next on the same foot.  HOW DO WE TREAT THIS?  There are several ways to treat this condition.  Treatment may depend on many factors such as age, medications, pregnancy, liver and kidney conditions, etc.  It is best to ask your doctor which options are available to you.  1. No treatment.   Unlike many other medical concerns, you can live with this condition.  However for many people this can be a painful condition and may lead to ingrown toenails or a bacterial infection.  It is recommended that you keep the nails cut short to help reduce the amount of fungal nail. 2. Topical treatment.  These range from herbal remedies to prescription strength nail lacquers.  About 40-50% effective, topicals require twice daily application for approximately 9 to 12 months or until an entirely new nail has grown out.  The most effective topicals are medical grade medications available through physicians offices. 3. Oral antifungal medications.  With an 80-90% cure rate, the most common oral medication requires 3 to 4 months of therapy and stays in your system for a year as the new nail grows out.  Oral  antifungal medications do require blood work to make sure it is a safe drug for you.  A liver function panel will be performed prior to starting the medication and after the first month of treatment.  It is important to have the blood work performed to avoid any harmful side effects.  In general, this medication safe but blood work is required. 4. Laser Therapy.  This treatment is performed by applying a specialized laser to the affected nail plate.  This therapy is noninvasive, fast, and non-painful.  It is not covered by insurance and is therefore, out of pocket.  The results have been very good with a 80-95% cure rate.  [facility] is the only practice in the area to offer this therapy. 5. Permanent Nail Avulsion.  Removing the entire nail so that a new nail will not grow back.  Athlete's Foot  Athlete's foot (tinea pedis) is a fungal infection of the skin on your feet. It often occurs on the skin that is between or underneath the toes. It can also occur on the soles of your feet. The infection can spread from person to person (is contagious). It can also spread when a person's bare feet come in contact with the fungus on shower floors or on items such as shoes. What are the causes? This condition is caused by a fungus that grows in warm, moist places. You can get athlete's foot by sharing shoes, shower stalls, towels, and wet floors with someone who is  infected. Not washing your feet or changing your socks often enough can also lead to athlete's foot. What increases the risk? This condition is more likely to develop in:  Men.  People who have a weak body defense system (immune system).  People who have diabetes.  People who use public showers, such as at a gym.  People who wear heavy-duty shoes, such as Environmental manager.  Seasons with warm, humid weather. What are the signs or symptoms? Symptoms of this condition include:  Itchy areas between your toes or on the  soles of your feet.  White, flaky, or scaly areas between your toes or on the soles of your feet.  Very itchy small blisters between your toes or on the soles of your feet.  Small cuts in your skin. These cuts can become infected.  Thick or discolored toenails. How is this diagnosed? This condition may be diagnosed with a physical exam and a review of your medical history. Your health care provider may also take a skin or toenail sample to examine under a microscope. How is this treated? This condition is treated with antifungal medicines. These may be applied as powders, ointments, or creams. In severe cases, an oral antifungal medicine may be given. Follow these instructions at home: Medicines  Apply or take over-the-counter and prescription medicines only as told by your health care provider.  Apply your antifungal medicine as told by your health care provider. Do not stop using the antifungal even if your condition improves. Foot care  Do not scratch your feet.  Keep your feet dry: ? Wear cotton or wool socks. Change your socks every day or if they become wet. ? Wear shoes that allow air to flow, such as sandals or canvas tennis shoes.  Wash and dry your feet, including the area between your toes. Also, wash and dry your feet: ? Every day or as told by your health care provider. ? After exercising. General instructions  Do not let others use towels, shoes, nail clippers, or other personal items that touch your feet.  Protect your feet by wearing sandals in wet areas, such as locker rooms and shared showers.  Keep all follow-up visits as told by your health care provider. This is important.  If you have diabetes, keep your blood sugar under control. Contact a health care provider if:  You have a fever.  You have swelling, soreness, warmth, or redness in your foot.  Your feet are not getting better with treatment.  Your symptoms get worse.  You have new symptoms.  Summary  Athlete's foot (tinea pedis) is a fungal infection of the skin on your feet. It often occurs on skin that is between or underneath the toes.  This condition is caused by a fungus that grows in warm, moist places.  Symptoms include white, flaky, or scaly areas between your toes or on the soles of your feet.  This condition is treated with antifungal medicines.  Keep your feet clean. Always dry them thoroughly. This information is not intended to replace advice given to you by your health care provider. Make sure you discuss any questions you have with your health care provider. Document Released: 09/11/2000 Document Revised: 07/05/2017 Document Reviewed: 07/05/2017 Elsevier Interactive Patient Education  2019 [employer].

## 2019-03-22 ENCOUNTER — Ambulatory Visit: Payer: Medicare HMO | Admitting: Podiatry

## 2019-03-22 ENCOUNTER — Other Ambulatory Visit: Payer: Self-pay

## 2019-03-22 ENCOUNTER — Encounter: Payer: Self-pay | Admitting: Podiatry

## 2019-03-22 DIAGNOSIS — B351 Tinea unguium: Secondary | ICD-10-CM | POA: Diagnosis not present

## 2019-03-22 DIAGNOSIS — L84 Corns and callosities: Secondary | ICD-10-CM

## 2019-03-22 DIAGNOSIS — M79675 Pain in left toe(s): Secondary | ICD-10-CM

## 2019-03-22 DIAGNOSIS — G5792 Unspecified mononeuropathy of left lower limb: Secondary | ICD-10-CM

## 2019-03-22 DIAGNOSIS — M79674 Pain in right toe(s): Secondary | ICD-10-CM | POA: Diagnosis not present

## 2019-03-22 NOTE — Patient Instructions (Signed)

## 2019-03-30 ENCOUNTER — Encounter: Payer: Self-pay | Admitting: Podiatry

## 2019-03-30 NOTE — Progress Notes (Signed)
Subjective: Amy Richards presents today with history of neuropathy. Patient seen for follow up of chronic, painful mycotic toenails, callus(es)/corn(s) which interfere with daily activities and routine tasks.  Pain is aggravated when wearing enclosed shoe gear. Pain is getting progressively worse and relieved with periodic professional debridement.   Bartholome Bill, MD is her PCP and last visit was six months ago per patient recall.   Current Outpatient Medications:  .  aspirin EC 81 MG tablet, Take by mouth., Disp: , Rfl:  .  atenolol (TENORMIN) 25 MG tablet, Take 25 mg by mouth daily. PM, Disp: , Rfl:  .  atorvastatin (LIPITOR) 10 MG tablet, Take 10 mg by mouth daily. PM, Disp: , Rfl:  .  bacitracin ophthalmic ointment, Use on sutures 4 times a day for 12-14 days, Disp: 3.5 g, Rfl: 3 .  Cholecalciferol (VITAMIN D-3 PO), Take by mouth daily., Disp: , Rfl:  .  Ciclopirox 0.77 % gel, Apply between toes twice daily for 4 weeks., Disp: 45 g, Rfl: 0 .  Coenzyme Q10 (CO Q10 PO), Take by mouth daily., Disp: , Rfl:  .  Cranberry 500 MG CAPS, Take by mouth., Disp: , Rfl:  .  loratadine (CLARITIN) 10 MG tablet, Take by mouth., Disp: , Rfl:  .  neomycin-polymyxin b-dexamethasone (MAXITROL) 3.5-10000-0.1 SUSP, Use 1 drop in operative eye 3 times a day for 2 weeks, Disp: 1 Bottle, Rfl: 0 .  NON FORMULARY, Antifungal Terbinafine 3%; Fluconazole 2% Tea Tree Oil; 5% Urea; 10% Ibuprofen; 2% in DMSO Suspension #30 ML Apply to affected nail(s) once (at bedtime), Disp: , Rfl:  .  Omega 3-6-9 CAPS, Take by mouth daily., Disp: , Rfl:  .  Omega-3 1000 MG CAPS, Take by mouth., Disp: , Rfl:  .  pravastatin (PRAVACHOL) 20 MG tablet, Take by mouth., Disp: , Rfl:  .  Probiotic Product (PROBIOTIC DAILY PO), Take by mouth., Disp: , Rfl:  .  Red Yeast Rice 600 MG TABS, Take by mouth., Disp: , Rfl:  .  TURMERIC PO, Take by mouth daily., Disp: , Rfl:   No Known Allergies  Objective: There were no vitals filed  for this visit.  Vascular Examination: Capillary refill time immediate x 10 digits.  Dorsalis pedis pulses present b/l.  Posterior tibial pulses present b/l.  Digital hair sparse x 10 digits.  Skin temperature gradient WNL b/l.  Dermatological Examination: Skin with normal turgor, texture and tone b/l.  Toenails 1-5 b/l discolored, thick, dystrophic with subungual debris and pain with palpation to nailbeds due to thickness of nails.  Hyperkeratotic lesion(s) submet head 2 b/l, dorsal right 2nd PIPJ, medial DIPJ right 2nd digit, dorsal left 5th PIPJ. No erythema, no edema, no drainage, no flocculence noted.   Musculoskeletal: Muscle strength 5/5 to all LE muscle groups of the RLE.  Left foot reveals muscle strength 2/5 for dorsiflexion.  Neurological: Sensation absent left foot with 10 gram monofilament.  Assessment: 1. Painful onychomycosis toenails 1-5 b/l 2. Calluses submet head 2 b/l 3. Corns dorsal right 2nd PIPJ, medial DIPJ right 2nd digit, dorsal left 5th PIPJ 4. Neuropathy left foot   Plan: 1. Toenails 1-5 b/l were debrided in length and girth without iatrogenic bleeding. Continue [facility] topical antifungal solution daily. 2. Calluses pared submetatarsal head(s) 2 b/l utilizing sterile scalpel blade without incident.  3. Corn(s) pareddorsal right 2nd PIPJ, medial DIPJ right 2nd digit, dorsal left 5th PIPJ utilizing sterile scalpel blade without incident.  4. Patient to continue soft, supportive  shoe gear daily. 5. Patient to report any pedal injuries to medical professional immediately. 6. Follow up 9 weeks. 7. Patient/POA to call should there be a concern in the interim.

## 2019-04-30 ENCOUNTER — Emergency Department (HOSPITAL_COMMUNITY)
Admission: EM | Admit: 2019-04-30 | Discharge: 2019-04-30 | Disposition: A | Discharge status: Home / Self Care | Payer: Medicare HMO | Attending: Emergency Medicine | Admitting: Emergency Medicine

## 2019-04-30 ENCOUNTER — Encounter (HOSPITAL_COMMUNITY): Payer: Self-pay | Admitting: Emergency Medicine

## 2019-04-30 ENCOUNTER — Other Ambulatory Visit: Payer: Self-pay

## 2019-04-30 DIAGNOSIS — R040 Epistaxis: Secondary | ICD-10-CM | POA: Insufficient documentation

## 2019-04-30 DIAGNOSIS — Z7982 Long term (current) use of aspirin: Secondary | ICD-10-CM | POA: Insufficient documentation

## 2019-04-30 DIAGNOSIS — Z79899 Other long term (current) drug therapy: Secondary | ICD-10-CM | POA: Insufficient documentation

## 2019-04-30 MED ORDER — TRANEXAMIC ACID 1000 MG/10ML IV SOLN
500.0000 mg | Freq: Once | INTRAVENOUS | Status: DC
  Filled 2019-04-30: qty 10

## 2019-04-30 MED ORDER — OXYMETAZOLINE HCL 0.05 % NA SOLN
2.0000 | Freq: Once | NASAL | Status: DC
  Filled 2019-04-30: qty 30

## 2019-04-30 NOTE — ED Notes (Signed)
In to speak with patient, pt has no bleeding at the time, pt states she was able to expel large clot orally and bleeding has now stopped. No medications have been given. Family at bedside.

## 2019-04-30 NOTE — ED Provider Notes (Signed)
[place] EMERGENCY DEPARTMENT Provider Note   CSN: [id number] Arrival date & time: 04/30/19  1759     History   Chief Complaint Chief Complaint  Patient presents with  . Epistaxis    HPI Amy Richards is a 73 y.o. female.     Approximately 1 hour prior to arrival patient developed a left nostril nosebleed.  Patient washed her face bleeding stopped but then it started again.  No prior history of any bleeding.  Patient not on blood thinners.  No lightheadedness or syncope.  No upper respiratory infection symptoms.     Past Medical History:  Diagnosis Date  . Dental crowns present    right upper  . Foot drop, left    wears brace at night  . Hypercholesteremia   . Left leg numbness    s/p accident 2010, numb from knee to foot  . MVP (mitral valve prolapse)    palpatations  . PSVT (paroxysmal supraventricular tachycardia) [facility])     Patient Active Problem List   Diagnosis Date Noted  . Osteoarthritis of left knee 11/17/2018  . Osteoarthritis of knee 11/17/2018  . Pain in left foot 11/17/2018  . Impaired fasting glucose 07/09/2017  . Family history of colon cancer 11/20/2015  . Mitral valve prolapse 05/02/2015  . S/P repair of ventral hernia 07/05/2013  . Supraventricular tachycardia ([place]) 02/07/2013  . Simple renal cyst 01/11/2013  . Other symptoms involving nervous and musculoskeletal systems(781.99) 02/02/2012  . Mixed hyperlipidemia 09/28/2011    Past Surgical History:  Procedure Laterality Date  . BARTHOLIN GLAND CYST EXCISION    . BROW LIFT Bilateral 11/05/2015   Procedure: BLEPHAROPLASTY UPPER WITH EXCESS SKIN AND LOWER COSMETIC;  Surgeon: Karle Starch, MD;  Location: [facility];  Service: Ophthalmology;  Laterality: Bilateral;  . CHALAZION EXCISION Bilateral 11/05/2015   Procedure: EXCISION CHALAZION LEFT UPPER LID;  Surgeon: Karle Starch, MD;  Location: [place];  Service: Ophthalmology;  Laterality: Bilateral;  .  COLONOSCOPY    . HERNIA REPAIR     hiatel hernia  . PTOSIS REPAIR Bilateral 11/05/2015   Procedure: PTOSIS REPAIR;  Surgeon: Karle Starch, MD;  Location: [facility];  Service: Ophthalmology;  Laterality: Bilateral;  . THROAT SURGERY     excision of nodules from vocal cords  . UTERINE ARTERY EMBOLIZATION  1998     OB History   No obstetric history on file.      Home Medications    Prior to Admission medications   Medication Sig Start Date End Date Taking? Authorizing Provider  aspirin EC 81 MG tablet Take by mouth.    [provider]  atenolol (TENORMIN) 25 MG tablet Take 25 mg by mouth daily. PM    [provider]  atorvastatin (LIPITOR) 10 MG tablet Take 10 mg by mouth daily. PM    [provider]  bacitracin ophthalmic ointment Use on sutures 4 times a day for 12-14 days 11/05/15   Karle Starch, MD  Cholecalciferol (VITAMIN D-3 PO) Take by mouth daily.    [provider]  Ciclopirox 0.77 % gel Apply between toes twice daily for 4 weeks. 01/17/19   Marzetta Board, DPM  Coenzyme Q10 (CO Q10 PO) Take by mouth daily.    [provider]  Cranberry 500 MG CAPS Take by mouth.    [provider]  loratadine (CLARITIN) 10 MG tablet Take by mouth.    [provider]  neomycin-polymyxin  b-dexamethasone (MAXITROL) 3.5-10000-0.1 SUSP Use 1 drop in operative eye 3 times a day for 2 weeks 11/05/15   Karle Starch, MD  NON FORMULARY Antifungal Terbinafine 3%; Fluconazole 2% Tea Tree Oil; 5% Urea; 10% Ibuprofen; 2% in DMSO Suspension #30 ML Apply to affected nail(s) once (at bedtime)    [provider]  Omega 3-6-9 CAPS Take by mouth daily.    [provider]  Omega-3 1000 MG CAPS Take by mouth. 07/08/17   [provider]  pravastatin (PRAVACHOL) 20 MG tablet Take by mouth. 10/29/16   [provider]  Probiotic Product (PROBIOTIC DAILY PO) Take by mouth.    [provider]  Red  Yeast Rice 600 MG TABS Take by mouth.    [provider]  TURMERIC PO Take by mouth daily.    [provider]    Family History No family history on file.  Social History Social History   Tobacco Use  . Smoking status: Never Smoker  . Smokeless tobacco: Never Used  Substance Use Topics  . Alcohol use: Yes    Alcohol/week: 3.0 standard drinks    Types: 3 Glasses of wine per week  . Drug use: Not on file     Allergies   Patient has no known allergies.   Review of Systems Review of Systems  Constitutional: Negative for chills and fever.  HENT: Positive for nosebleeds. Negative for congestion, ear pain and sore throat.   Eyes: Negative for pain and visual disturbance.  Respiratory: Negative for cough and shortness of breath.   Cardiovascular: Negative for chest pain and palpitations.  Gastrointestinal: Negative for abdominal pain and vomiting.  Genitourinary: Negative for dysuria and hematuria.  Musculoskeletal: Negative for arthralgias and back pain.  Skin: Negative for color change and rash.  Neurological: Negative for seizures and syncope.  All other systems reviewed and are negative.    Physical Exam Updated Vital Signs BP (!) 146/84   Pulse 84   Temp 98.6 F (37 C)   Resp 18   Ht 1.702 m (5\' 7" )   Wt 80 kg   SpO2 98%   BMI 27.62 kg/m   Physical Exam Vitals signs and nursing note reviewed.  Constitutional:      General: She is not in acute distress.    Appearance: She is well-developed.  HENT:     Head: Normocephalic and atraumatic.     Nose:     Comments: Left nares area with a dried scab probably source of bleeding.  Anterior part of the nose.  Right nares clear.    Mouth/Throat:     Comments: No blood draining down the back of the throat. Eyes:     Extraocular Movements: Extraocular movements intact.     Conjunctiva/sclera: Conjunctivae normal.     Pupils: Pupils are equal, round, and reactive to light.  Neck:      Musculoskeletal: Normal range of motion and neck supple.  Cardiovascular:     Rate and Rhythm: Normal rate and regular rhythm.     Heart sounds: No murmur.  Pulmonary:     Effort: Pulmonary effort is normal. No respiratory distress.     Breath sounds: Normal breath sounds.  Abdominal:     Palpations: Abdomen is soft.     Tenderness: There is no abdominal tenderness.  Skin:    General: Skin is warm and dry.     Capillary Refill: Capillary refill takes less than 2 seconds.  Neurological:  General: No focal deficit present.     Mental Status: She is alert and oriented to person, place, and time.      ED Treatments / Results  Labs (all labs ordered are listed, but only abnormal results are displayed) Labs Reviewed - No data to display  EKG None  Radiology No results found.  Procedures Procedures (including critical care time)  Medications Ordered in ED Medications  oxymetazoline (AFRIN) 0.05 % nasal spray 2 spray (has no administration in time range)  tranexamic acid (CYKLOKAPRON) injection 500 mg (has no administration in time range)     Initial Impression / Assessment and Plan / ED Course  I have reviewed the triage vital signs and the nursing notes.  Pertinent labs & imaging results that were available during my care of the patient were reviewed by me and considered in my medical decision making (see chart for details).        Bleeding stopped spontaneously.  Evidence of left anterior scab probably the source of the bleeding.  Precautions provided.  Referral to ear nose and throat provided.  Patient's vital signs are stable.  Patient not on blood thinners.  Final Clinical Impressions(s) / ED Diagnoses   Final diagnoses:  Left-sided epistaxis    ED Discharge Orders    None       Fredia Sorrow, MD 04/30/19 2044

## 2019-04-30 NOTE — ED Provider Notes (Signed)
MSE was initiated and I personally evaluated the patient and placed orders (if any) at  6:23 PM on April 30, 2019.   Patient with 1 hour of persistent nosebleeding from the left nare.  No provocation.  No history of bleeds.  Not on thinners.  On exam, patient with persistent heavy bleeding from the left nare, unable to visualize source due to amount of blood.  Will start Afrin and nebulized TXA and have patient moved to an acute room for further management.  The patient appears stable so that the remainder of the MSE may be completed by another provider.   Franchot Heidelberg, PA-C 04/30/19 Youlanda Mighty, MD 04/30/19 2042

## 2019-04-30 NOTE — ED Notes (Signed)
Spoke with pharmacy to send medication ASAP.

## 2019-04-30 NOTE — Discharge Instructions (Addendum)
Be careful not to aggravate the nose because it can result in recurrent bleeding.  Make an appointment follow-up with ear nose and throat for further evaluation.  Use the pinch technique that we talked about.  If the nose persistent bleed return for reevaluation.

## 2019-04-30 NOTE — ED Notes (Signed)
Triage nurse had provider see patient during triage.

## 2019-04-30 NOTE — ED Triage Notes (Signed)
Onset 1-2 hours ago developed a nose bleed left nostril. Patient states washed face then bleeding stopped. Nose started to bleed again applied pressure however still bleeding. Airway intact bilateral equal chest rise and fall.

## 2019-05-24 ENCOUNTER — Encounter: Payer: Self-pay | Admitting: Podiatry

## 2019-05-24 ENCOUNTER — Other Ambulatory Visit: Payer: Self-pay

## 2019-05-24 ENCOUNTER — Ambulatory Visit: Payer: Medicare HMO | Admitting: Podiatry

## 2019-05-24 DIAGNOSIS — G5792 Unspecified mononeuropathy of left lower limb: Secondary | ICD-10-CM

## 2019-05-24 DIAGNOSIS — L84 Corns and callosities: Secondary | ICD-10-CM | POA: Diagnosis not present

## 2019-05-24 DIAGNOSIS — M79675 Pain in left toe(s): Secondary | ICD-10-CM | POA: Diagnosis not present

## 2019-05-24 DIAGNOSIS — M79674 Pain in right toe(s): Secondary | ICD-10-CM | POA: Diagnosis not present

## 2019-05-24 DIAGNOSIS — B351 Tinea unguium: Secondary | ICD-10-CM | POA: Diagnosis not present

## 2019-05-24 NOTE — Patient Instructions (Signed)
Peripheral Neuropathy Peripheral neuropathy is a type of nerve damage. It affects nerves that carry signals between the spinal cord and the arms, legs, and the rest of the body (peripheral nerves). It does not affect nerves in the spinal cord or brain. In peripheral neuropathy, one nerve or a group of nerves may be damaged. Peripheral neuropathy is a broad category that includes many specific nerve disorders, like diabetic neuropathy, hereditary neuropathy, and carpal tunnel syndrome. What are the causes? This condition may be caused by:  Diabetes. This is the most common cause of peripheral neuropathy.  Nerve injury.  Pressure or stress on a nerve that lasts a long time.  Lack (deficiency) of B vitamins. This can result from alcoholism, poor diet, or a restricted diet.  Infections.  Autoimmune diseases, such as rheumatoid arthritis and systemic lupus erythematosus.  Nerve diseases that are passed from parent to child (inherited).  Some medicines, such as cancer medicines (chemotherapy).  Poisonous (toxic) substances, such as lead and mercury.  Too little blood flowing to the legs.  Kidney disease.  Thyroid disease. In some cases, the cause of this condition is not known. What are the signs or symptoms? Symptoms of this condition depend on which of your nerves is damaged. Common symptoms include:  Loss of feeling (numbness) in the feet, hands, or both.  Tingling in the feet, hands, or both.  Burning pain.  Very sensitive skin.  Weakness.  Not being able to move a part of the body (paralysis).  Muscle twitching.  Clumsiness or poor coordination.  Loss of balance.  Not being able to control your bladder.  Feeling dizzy.  Sexual problems. How is this diagnosed? Diagnosing and finding the cause of peripheral neuropathy can be difficult. Your health care provider will take your medical history and do a physical exam. A neurological exam will also be done. This  involves checking things that are affected by your brain, spinal cord, and nerves (nervous system). For example, your health care provider will check your reflexes, how you move, and what you can feel. You may have other tests, such as:  Blood tests.  Electromyogram (EMG) and nerve conduction tests. These tests check nerve function and how well the nerves are controlling the muscles.  Imaging tests, such as CT scans or MRI to rule out other causes of your symptoms.  Removing a small piece of nerve to be examined in a lab (nerve biopsy). This is rare.  Removing and examining a small amount of the fluid that surrounds the brain and spinal cord (lumbar puncture). This is rare. How is this treated? Treatment for this condition may involve:  Treating the underlying cause of the neuropathy, such as diabetes, kidney disease, or vitamin deficiencies.  Stopping medicines that can cause neuropathy, such as chemotherapy.  Medicine to relieve pain. Medicines may include: ? Prescription or over-the-counter pain medicine. ? Antiseizure medicine. ? Antidepressants. ? Pain-relieving patches that are applied to painful areas of skin.  Surgery to relieve pressure on a nerve or to destroy a nerve that is causing pain.  Physical therapy to help improve movement and balance.  Devices to help you move around (assistive devices). Follow these instructions at home: Medicines  Take over-the-counter and prescription medicines only as told by your health care provider. Do not take any other medicines without first asking your health care provider.  Do not drive or use heavy machinery while taking prescription pain medicine. Lifestyle   Do not use any products that contain nicotine   or tobacco, such as cigarettes and e-cigarettes. Smoking keeps blood from reaching damaged nerves. If you need help quitting, ask your health care provider.  Avoid or limit alcohol. Too much alcohol can cause a vitamin B  deficiency, and vitamin B is needed for healthy nerves.  Eat a healthy diet. This includes: ? Eating foods that are high in fiber, such as fresh fruits and vegetables, whole grains, and beans. ? Limiting foods that are high in fat and processed sugars, such as fried or sweet foods. General instructions   If you have diabetes, work closely with your health care provider to keep your blood sugar under control.  If you have numbness in your feet: ? Check every day for signs of injury or infection. Watch for redness, warmth, and swelling. ? Wear padded socks and comfortable shoes. These help protect your feet.  Develop a good support system. Living with peripheral neuropathy can be stressful. Consider talking with a mental health specialist or joining a support group.  Use assistive devices and attend physical therapy as told by your health care provider. This may include using a walker or a cane.  Keep all follow-up visits as told by your health care provider. This is important. Contact a health care provider if:  You have new signs or symptoms of peripheral neuropathy.  You are struggling emotionally from dealing with peripheral neuropathy.  Your pain is not well-controlled. Get help right away if:  You have an injury or infection that is not healing normally.  You develop new weakness in an arm or leg.  You fall frequently. Summary  Peripheral neuropathy is when the nerves in the arms, or legs are damaged, resulting in numbness, weakness, or pain.  There are many causes of peripheral neuropathy, including diabetes, pinched nerves, vitamin deficiencies, autoimmune disease, and hereditary conditions.  Diagnosing and finding the cause of peripheral neuropathy can be difficult. Your health care provider will take your medical history, do a physical exam, and do tests, including blood tests and nerve function tests.  Treatment involves treating the underlying cause of the  neuropathy and taking medicines to help control pain. Physical therapy and assistive devices may also help. This information is not intended to replace advice given to you by your health care provider. Make sure you discuss any questions you have with your health care provider. Document Released: 09/04/2002 Document Revised: 08/27/2017 Document Reviewed: 11/23/2016 Elsevier Patient Education  2020 [employer]   Onychomycosis/Fungal Toenails  WHAT IS IT? An infection that lies within the keratin of your nail plate that is caused by a fungus.  WHY ME? Fungal infections affect all ages, sexes, races, and creeds.  There may be many factors that predispose you to a fungal infection such as age, coexisting medical conditions such as diabetes, or an autoimmune disease; stress, medications, fatigue, genetics, etc.  Bottom line: fungus thrives in a warm, moist environment and your shoes offer such a location.  IS IT CONTAGIOUS? Theoretically, yes.  You do not want to share shoes, nail clippers or files with someone who has fungal toenails.  Walking around barefoot in the same room or sleeping in the same bed is unlikely to transfer the organism.  It is important to realize, however, that fungus can spread easily from one nail to the next on the same foot.  HOW DO WE TREAT THIS?  There are several ways to treat this condition.  Treatment may depend on many factors such as age, medications, pregnancy, liver and   kidney conditions, etc.  It is best to ask your doctor which options are available to you.  1. No treatment.   Unlike many other medical concerns, you can live with this condition.  However for many people this can be a painful condition and may lead to ingrown toenails or a bacterial infection.  It is recommended that you keep the nails cut short to help reduce the amount of fungal nail. 2. Topical treatment.  These range from herbal remedies to prescription strength nail lacquers.  About 40-50%  effective, topicals require twice daily application for approximately 9 to 12 months or until an entirely new nail has grown out.  The most effective topicals are medical grade medications available through physicians offices. 3. Oral antifungal medications.  With an 80-90% cure rate, the most common oral medication requires 3 to 4 months of therapy and stays in your system for a year as the new nail grows out.  Oral antifungal medications do require blood work to make sure it is a safe drug for you.  A liver function panel will be performed prior to starting the medication and after the first month of treatment.  It is important to have the blood work performed to avoid any harmful side effects.  In general, this medication safe but blood work is required. 4. Laser Therapy.  This treatment is performed by applying a specialized laser to the affected nail plate.  This therapy is noninvasive, fast, and non-painful.  It is not covered by insurance and is therefore, out of pocket.  The results have been very good with a 80-95% cure rate.  The [facility] is the only practice in the area to offer this therapy. 5. Permanent Nail Avulsion.  Removing the entire nail so that a new nail will not grow back. 

## 2019-06-01 NOTE — Progress Notes (Signed)
Subjective: Amy Richards presents to clinic with history of neuropathy with painful mycotic toenails corns and calluses bilaterally.  Pain which is aggravated when weightbearing with and without shoe gear.  This pain limits her daily activities. Pain symptoms resolve with periodic professional debridement.  Bartholome Bill, MD is her PCP.   Current Outpatient Medications:  Marland Kitchen  Misc Natural Products (ESTROVEN + ENERGY MAX STRENGTH) TABS, , Disp: , Rfl:  .  aspirin EC 81 MG tablet, Take by mouth., Disp: , Rfl:  .  atenolol (TENORMIN) 25 MG tablet, Take 25 mg by mouth daily. PM, Disp: , Rfl:  .  atorvastatin (LIPITOR) 10 MG tablet, Take 10 mg by mouth daily. PM, Disp: , Rfl:  .  bacitracin ophthalmic ointment, Use on sutures 4 times a day for 12-14 days, Disp: 3.5 g, Rfl: 3 .  Cholecalciferol (VITAMIN D-3 PO), Take by mouth daily., Disp: , Rfl:  .  Ciclopirox 0.77 % gel, Apply between toes twice daily for 4 weeks., Disp: 45 g, Rfl: 0 .  Coenzyme Q10 (CO Q10 PO), Take by mouth daily., Disp: , Rfl:  .  Cranberry 500 MG CAPS, Take by mouth., Disp: , Rfl:  .  loratadine (CLARITIN) 10 MG tablet, Take by mouth., Disp: , Rfl:  .  neomycin-polymyxin b-dexamethasone (MAXITROL) 3.5-10000-0.1 SUSP, Use 1 drop in operative eye 3 times a day for 2 weeks, Disp: 1 Bottle, Rfl: 0 .  NON FORMULARY, Antifungal Terbinafine 3%; Fluconazole 2% Tea Tree Oil; 5% Urea; 10% Ibuprofen; 2% in DMSO Suspension #30 ML Apply to affected nail(s) once (at bedtime), Disp: , Rfl:  .  Omega 3-6-9 CAPS, Take by mouth daily., Disp: , Rfl:  .  Omega-3 1000 MG CAPS, Take by mouth., Disp: , Rfl:  .  pravastatin (PRAVACHOL) 20 MG tablet, Take by mouth., Disp: , Rfl:  .  Probiotic Product (PROBIOTIC DAILY PO), Take by mouth., Disp: , Rfl:  .  Red Yeast Rice 600 MG TABS, Take by mouth., Disp: , Rfl:  .  TURMERIC PO, Take by mouth daily., Disp: , Rfl:    No Known Allergies   Objective: There were no vitals filed for this  visit.  Physical Examination:  Vascular  Examination: Capillary refill time is immediate to all 10 digits.  Dorsalis pedis pulses and posterior tibial pulses are present bilaterally.  Digital hair sparse to all 10 digits.  Skin temperature gradient within normal limits bilaterally.  Dermatological Examination: Pedal skin with normal turgor texture and tone bilaterally.  5 bilaterally are elongated, discolored, thickened, dystrophic with subungual debris.  There is tenderness palpation on the nails.  Hyperkeratotic lesions noted submetatarsal head 2 bilaterally dorsal right second PIPJ, medial DIPJ, right second digit, dorsal left fifth digit.  There is no erythema, no edema, no drainage, no flocculence noted with either of these lesions.  Musculoskeletal Examination: Muscle strength 5/5 to all muscle groups of the right lower extremity.  Left foot dorsiflexion 2/5.  Dorsiflexion, inversion and eversion are noted to be  5/5.  No pain, crepitus or joint discomfort with active/passive ROM.  Neurological Examination: Sensation absent 5/5 b/l with 10 gram monofilament.  Assessment: 1. Mycotic nail infection with pain 1-5 b/l 2. Corns dorsal right second DIPJ, medial DIPJ right second digit and dorsal left fifth digit PIPJ 3. Calluses submetatarsal head 2 bilaterally 4. Neuropathy left foot  Plan: 1. Toenails 1-5 b/l were debrided in length and girth without iatrogenic laceration. 2. Corns and calluses were pared sub-met head  2 bilaterally dorsal right second PIPJ medial DIPJ, right second digit, dorsal left fifth PIPJ and submetatarsal head 2 bilaterally. 3. Continue soft, supportive shoe gear daily. 4. Report any pedal injuries to medical professional. 5. Follow up 9 weeks. 6. Patient/POA to call should there be a question/concern in there interim.

## 2019-07-28 ENCOUNTER — Ambulatory Visit: Payer: Medicare HMO | Admitting: Podiatry

## 2019-07-28 ENCOUNTER — Encounter: Payer: Self-pay | Admitting: Podiatry

## 2019-07-28 ENCOUNTER — Other Ambulatory Visit: Payer: Self-pay

## 2019-07-28 DIAGNOSIS — L84 Corns and callosities: Secondary | ICD-10-CM | POA: Diagnosis not present

## 2019-07-28 DIAGNOSIS — B351 Tinea unguium: Secondary | ICD-10-CM | POA: Diagnosis not present

## 2019-07-28 DIAGNOSIS — M79675 Pain in left toe(s): Secondary | ICD-10-CM

## 2019-07-28 DIAGNOSIS — M79674 Pain in right toe(s): Secondary | ICD-10-CM

## 2019-07-28 DIAGNOSIS — G5792 Unspecified mononeuropathy of left lower limb: Secondary | ICD-10-CM | POA: Diagnosis not present

## 2019-07-29 NOTE — Progress Notes (Signed)
Subjective: Amy Richards is seen today for follow up painful corns and calluses b/l, elongated, thickened toenails 1-5 b/l feet that she cannot cut. Pain interferes with daily activities. Aggravating factor includes wearing enclosed shoe gear and relieved with periodic debridement. 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  Current Outpatient Medications on File Prior to Visit  Medication Sig  . aspirin EC 81 MG tablet Take by mouth.  Marland Kitchen atenolol (TENORMIN) 25 MG tablet Take 25 mg by mouth daily. PM  . atorvastatin (LIPITOR) 10 MG tablet Take 10 mg by mouth daily. PM  . bacitracin ophthalmic ointment Use on sutures 4 times a day for 12-14 days  . Cholecalciferol (VITAMIN D-3 PO) Take by mouth daily.  . Ciclopirox 0.77 % gel Apply between toes twice daily for 4 weeks.  . Coenzyme Q10 (CO Q10 PO) Take by mouth daily.  . Cranberry 500 MG CAPS Take by mouth.  . loratadine (CLARITIN) 10 MG tablet Take by mouth.  . Misc Natural Products (ESTROVEN + ENERGY MAX STRENGTH) TABS   . NON FORMULARY Antifungal Terbinafine 3%; Fluconazole 2% Tea Tree Oil; 5% Urea; 10% Ibuprofen; 2% in DMSO Suspension #30 ML Apply to affected nail(s) once (at bedtime)  . Omega 3-6-9 CAPS Take by mouth daily.  . Omega-3 1000 MG CAPS Take by mouth.  . pravastatin (PRAVACHOL) 20 MG tablet Take by mouth.  . Probiotic Product (PROBIOTIC DAILY PO) Take by mouth.  . TURMERIC PO Take by mouth daily.  Marland Kitchen UNABLE TO FIND Mineral Rich - liquid juice  . UNABLE TO FIND Apricot Seeds - takes 3 a day  Manuka raw honey - one teaspoon a day   No current facility-administered medications on file prior to visit.      No Known Allergies   Objective:  Vascular Examination: Capillary refill time immediate x 10 digits.  Dorsalis pedis present b/l.  Posterior tibial pulses present b/l.  Digital hair sparse b/l.  Skin temperature gradient WNL b/l.   Dermatological Examination: Skin with normal turgor, texture and tone  b/l  Toenails 1-5 b/l discolored, thick, dystrophic with subungual debris and pain with palpation to nailbeds due to thickness of nails.  Hyperkeratotic lesion submet head  2 b/l, dorsal 2nd PIPJ/DIPJ, left 5th digit with tenderness to palpation. No edema, no erythema, no drainage, no flocculence.  Musculoskeletal: Muscle strength 5/5 to all RLE muscle groups  Left foot with muscle strength 2/5 dorsiflexion. Plantarflexion, inversion, eversion 5/5.  No gross bony deformities b/l.  No pain, crepitus or joint limitation noted with ROM.   Neurological Examination: Protective sensation absent left foot when tested with 10 gram monofilament bilaterally.  Assessment: Painful onychomycosis toenails 1-5 b/l  Calluses submet head 2 b/l Corns right 2nd digit, dorsal 5th digit left foot Neuropathy left foot  Plan: 1. Toenails 1-5 b/l were debrided in length and girth without iatrogenic bleeding. 2. Calluses pared submetatarsal head(s) 2 b/l utilizing sterile scalpel blade without incident. Corn(s) right 2nd toe pared utilizing sterile scalpel blade without incident. 3. Patient to continue soft, supportive shoe gearPatient to report any pedal injuries to medical professional immediately. 4. Follow up 9 weeks. 5. Patient/POA to call should there be a concern in the interim.

## 2019-10-02 ENCOUNTER — Ambulatory Visit: Payer: Medicare HMO | Admitting: Podiatry

## 2019-10-02 ENCOUNTER — Other Ambulatory Visit: Payer: Self-pay

## 2019-10-02 ENCOUNTER — Encounter: Payer: Self-pay | Admitting: Podiatry

## 2019-10-02 DIAGNOSIS — M79674 Pain in right toe(s): Secondary | ICD-10-CM

## 2019-10-02 DIAGNOSIS — B351 Tinea unguium: Secondary | ICD-10-CM | POA: Diagnosis not present

## 2019-10-02 DIAGNOSIS — M79675 Pain in left toe(s): Secondary | ICD-10-CM

## 2019-10-02 DIAGNOSIS — G5792 Unspecified mononeuropathy of left lower limb: Secondary | ICD-10-CM

## 2019-10-02 DIAGNOSIS — L84 Corns and callosities: Secondary | ICD-10-CM

## 2019-10-02 NOTE — Patient Instructions (Addendum)
EPSOM SALT FOOT SOAK INSTRUCTIONS  1.  Place 1/4 cup of epsom salts in 2 quarts of warm tap water. IF YOU ARE DIABETIC, OR HAVE NEUROPATHY,  CHECK THE TEMPERATURE OF THE WATER WITH YOUR ELBOW.  2.  Submerge your foot/feet in the solution and soak for 20 minutes.      3.  Next, remove your foot or feet from solution, blot dry the affected area.    4.  Apply antibiotic ointment and cover with fabric band-aid .  5.  This soak should be done once a day for 1 week.   6.  Monitor for any signs/symptoms of infection such as redness, swelling, odor, drainage, increased pain, or non-healing of digit.   7.  Please do not hesitate to call the office and speak to a Nurse or Doctor if you have questions.   8.  If you experience fever, chills, nightsweats, nausea or vomiting with worsening of digit, please go to the emergency room.    Ingrown Toenail An ingrown toenail occurs when the corner or sides of a toenail grow into the surrounding skin. This causes discomfort and pain. The big toe is most commonly affected, but any of the toes can be affected. If an ingrown toenail is not treated, it can become infected. What are the causes? This condition may be caused by:  Wearing shoes that are too small or tight.  An injury, such as stubbing your toe or having your toe stepped on.  Improper cutting or care of your toenails.  Having nail or foot abnormalities that were present from birth (congenital abnormalities), such as having a nail that is too big for your toe. What increases the risk? The following factors may make you more likely to develop ingrown toenails:  Age. Nails tend to get thicker with age, so ingrown nails are more common among older people.  Cutting your toenails incorrectly, such as cutting them very short or cutting them unevenly. An ingrown toenail is more likely to get infected if you have:  Diabetes.  Blood flow (circulation) problems. What are the signs or  symptoms? Symptoms of an ingrown toenail may include:  Pain, soreness, or tenderness.  Redness.  Swelling.  Hardening of the skin that surrounds the toenail. Signs that an ingrown toenail may be infected include:  Fluid or pus.  Symptoms that get worse instead of better. How is this diagnosed? An ingrown toenail may be diagnosed based on your medical history, your symptoms, and a physical exam. If you have fluid or blood coming from your toenail, a sample may be collected to test for the specific type of bacteria that is causing the infection. How is this treated? Treatment depends on how severe your ingrown toenail is. You may be able to care for your toenail at home.  If you have an infection, you may be prescribed antibiotic medicines.  If you have fluid or pus draining from your toenail, your health care provider may drain it.  If you have trouble walking, you may be given crutches to use.  If you have a severe or infected ingrown toenail, you may need a procedure to remove part or all of the nail. Follow these instructions at home: Foot care   Do not pick at your toenail or try to remove it yourself.  Soak your foot in warm, soapy water. Do this for 20 minutes, 3 times a day, or as often as told by your health care provider. This helps to keep your  toe clean and keep your skin soft.  Wear shoes that fit well and are not too tight. Your health care provider may recommend that you wear open-toed shoes while you heal.  Trim your toenails regularly and carefully. Cut your toenails straight across to prevent injury to the skin at the corners of the toenail. Do not cut your nails in a curved shape.  Keep your feet clean and dry to help prevent infection. Medicines  Take over-the-counter and prescription medicines only as told by your health care provider.  If you were prescribed an antibiotic, take it as told by your health care provider. Do not stop taking the antibiotic  even if you start to feel better. Activity  Return to your normal activities as told by your health care provider. Ask your health care provider what activities are safe for you.  Avoid activities that cause pain. General instructions  If your health care provider told you to use crutches to help you move around, use them as instructed.  Keep all follow-up visits as told by your health care provider. This is important. Contact a health care provider if:  You have more redness, swelling, pain, or other symptoms that do not improve with treatment.  You have fluid, blood, or pus coming from your toenail. Get help right away if:  You have a red streak on your skin that starts at your foot and spreads up your leg.  You have a fever. Summary  An ingrown toenail occurs when the corner or sides of a toenail grow into the surrounding skin. This causes discomfort and pain. The big toe is most commonly affected, but any of the toes can be affected.  If an ingrown toenail is not treated, it can become infected.  Fluid or pus draining from your toenail is a sign of infection. Your health care provider may need to drain it. You may be given antibiotics to treat the infection.  Trimming your toenails regularly and properly can help you prevent an ingrown toenail. This information is not intended to replace advice given to you by your health care provider. Make sure you discuss any questions you have with your health care provider. Document Revised: 01/06/2019 Document Reviewed: 06/02/2017 Elsevier Patient Education  [place].

## 2019-10-05 NOTE — Progress Notes (Signed)
Subjective: Amy Richards presents today with history of neuropathy left foot. Patient seen for follow up of chronic, painful mycotic toenails and calluses and corns which interfere with daily activities and routine tasks.  Pain is aggravated when wearing enclosed shoe gear. Pain is getting progressively worse and relieved with periodic professional debridement.   Today, she states her left 2nd toe is very tender and suspects ingrown toenail. She denies any redness, swelling or drainage. She also relates tender left 4th toe. Denies any drainage, redness or swelling.  Bartholome Bill, MD is her PCP.   Current Outpatient Medications on File Prior to Visit  Medication Sig Dispense Refill  . aspirin EC 81 MG tablet Take by mouth.    Marland Kitchen atenolol (TENORMIN) 25 MG tablet Take 25 mg by mouth daily. PM    . atorvastatin (LIPITOR) 10 MG tablet Take 10 mg by mouth daily. PM    . bacitracin ophthalmic ointment Use on sutures 4 times a day for 12-14 days 3.5 g 3  . Cholecalciferol (VITAMIN D-3 PO) Take by mouth daily.    . Ciclopirox 0.77 % gel Apply between toes twice daily for 4 weeks. 45 g 0  . Coenzyme Q10 (CO Q10 PO) Take by mouth daily.    . Cranberry 500 MG CAPS Take by mouth.    . loratadine (CLARITIN) 10 MG tablet Take by mouth.    . mineral oil liquid Take by mouth.    . Misc Natural Products (ESTROVEN + ENERGY MAX STRENGTH) TABS     . NON FORMULARY Antifungal Terbinafine 3%; Fluconazole 2% Tea Tree Oil; 5% Urea; 10% Ibuprofen; 2% in DMSO Suspension #30 ML Apply to affected nail(s) once (at bedtime)    . Omega 3-6-9 CAPS Take by mouth daily.    . Omega-3 1000 MG CAPS Take by mouth.    . pravastatin (PRAVACHOL) 20 MG tablet Take by mouth.    . Probiotic Product (PROBIOTIC DAILY PO) Take by mouth.    . TURMERIC PO Take by mouth daily.    Marland Kitchen UNABLE TO FIND Mineral Rich - liquid juice    . UNABLE TO FIND Apricot Seeds - takes 3 a day  Manuka raw honey - one teaspoon a day    . vitamin  B-12 (CYANOCOBALAMIN) 500 MCG tablet Take by mouth.     No current facility-administered medications on file prior to visit.    No Known Allergies  Objective: There were no vitals filed for this visit.  Vascular Examination: Capillary refill time immediate x 10 digits.  Dorsalis pedis and posterior tibial pulses present b/l.  Digital hair remains sparse b/l.   Skin temperature gradient WNL b/l.  Dermatological Examination: Skin with normal turgor, texture and tone b/l.  Toenails 1-5 b/l discolored, thick, dystrophic with subungual debris and pain with palpation to nailbeds due to thickness of nails.  Incurvated nailplate left 2nd digit medial border(s) with nail border hypertrophy and tenderness to palpation. No erythema, no edema, no drainage noted.  Hyperkeratotic lesion(s) submet head 2 b/l, dorsal 2nd PIPJ right foot, and left 5th digit. No erythema, no edema, no drainage, no flocculence noted.   Hyperkeratotic lesion dorsal 4th PIPJ with porokeratosis and postinflammatory hyperpigmentation. No underlying wound.  Musculoskeletal: Muscle strength 5/5 to all LE muscle groups of RLE.  Hammertoes lesser digits b/l.  HAV with bunion b/l.  Dropfoot LLE.   Neurological: Sensation diminished left foot with 10 gram monofilament.  Assessment: 1. Painful onychomycosis toenails 1-5 b/l 2. Calluses  submet head 2 b/l 3. Corns right 2nd digit, left 5th digit, left 4th digit 4. Neuropathy  Plan: 1.  Toenails 1-5 b/l were debrided in length and girth without iatrogenic bleeding. Offending nail border left 2nd toe debrided and curretaged left hallux. Light bleeding addressed with Lumicain Hemostatic Solution. Border cleansed with alcohol and triple antibiotic applied. Patient given written instructions for epsom salt soaks once daily for one week. Call office if condition does not resolve. We discussed need for partial nail avulsion if ingrown toenail continues. She related  understanding. 2.  Calluses pared submetatarsal head(s) 2 b/l utilizing sterile scalpel blade without incident. Corn(s) pared right 2nd, left 4th digit and left 5th digit utilizing sterile scalpel blade without incident.  3.  Discussed her shoe gear choice. Advised she stay away from leather dress shoes/flats/boots. She should choose shoes with high toe boxes or stretchable uppers.Patient to continue soft, supportive shoe gear daily. 4.  Patient to report any pedal injuries to medical professional immediately. 5.  Follow up 9 weeks. 6.  Patient/POA to call should there be a concern in the interim.

## 2019-12-04 ENCOUNTER — Ambulatory Visit: Payer: Medicare HMO | Admitting: Podiatry

## 2019-12-11 ENCOUNTER — Other Ambulatory Visit: Payer: Self-pay

## 2019-12-11 ENCOUNTER — Encounter: Payer: Self-pay | Admitting: Podiatry

## 2019-12-11 ENCOUNTER — Ambulatory Visit: Payer: Medicare HMO | Admitting: Podiatry

## 2019-12-11 VITALS — Temp 96.5°F

## 2019-12-11 DIAGNOSIS — M2042 Other hammer toe(s) (acquired), left foot: Secondary | ICD-10-CM

## 2019-12-11 DIAGNOSIS — M2041 Other hammer toe(s) (acquired), right foot: Secondary | ICD-10-CM | POA: Diagnosis not present

## 2019-12-11 DIAGNOSIS — B351 Tinea unguium: Secondary | ICD-10-CM

## 2019-12-11 DIAGNOSIS — L84 Corns and callosities: Secondary | ICD-10-CM | POA: Diagnosis not present

## 2019-12-11 DIAGNOSIS — G5792 Unspecified mononeuropathy of left lower limb: Secondary | ICD-10-CM | POA: Diagnosis not present

## 2019-12-11 DIAGNOSIS — M2012 Hallux valgus (acquired), left foot: Secondary | ICD-10-CM | POA: Diagnosis not present

## 2019-12-11 DIAGNOSIS — M79674 Pain in right toe(s): Secondary | ICD-10-CM | POA: Diagnosis not present

## 2019-12-11 DIAGNOSIS — M2011 Hallux valgus (acquired), right foot: Secondary | ICD-10-CM

## 2019-12-11 DIAGNOSIS — M79675 Pain in left toe(s): Secondary | ICD-10-CM | POA: Diagnosis not present

## 2019-12-11 NOTE — Progress Notes (Signed)
Subjective: Amy Richards presents today for follow up of follow up footcare with h/o neuropathy and corn(s) right great toe, right 2nd digit, left 4th, left 5th digits and callus(es) plantar aspect left foot and painful mycotic nails b/l.  Pain interferes with ambulation. Aggravating factors include wearing enclosed shoe gear.   She would like to know what options are available for right foot lesions.  No Known Allergies   Objective: Vitals:   12/11/19 0851  Temp: (!) 96.5 F (35.8 C)    [name] WD, WN in NAD. AAO x 3.   Vascular Examination:  Capillary refill time to digits immediate b/l. Palpable DP pulses b/l. Palpable PT pulses b/l. Pedal hair sparse b/l. Skin temperature gradient within normal limits b/l.  Dermatological Examination: Pedal skin with normal turgor, texture and tone bilaterally. No open wounds bilaterally. No interdigital macerations bilaterally. Toenails 1-5 b/l elongated, dystrophic, thickened, crumbly with subungual debris and tenderness to dorsal palpation. Hyperkeratotic lesion(s) interdigitally medial right hallux, lateral right 2nd digit, dorsal left 4th PIPJ, dorsal left 5th PIPJ and submet head 2 left foot.  No erythema, no edema, no drainage, no flocculence.  Musculoskeletal: Normal muscle strength 5/5 to all lower extremity muscle groups bilaterally, no pain crepitus or joint limitation noted with ROM b/l, bunion deformity noted b/l, hammertoes noted to the  2-5 bilaterally and patient ambulates independent of any assistive aids.  Neurological: Sensation diminished left foot with monofilament. Dropfoot LLE.  Assessment: 1. Pain due to onychomycosis of toenails of both feet   2. Corns and callosities   3. Hallux valgus, acquired, bilateral   4. Acquired hammertoes of both feet   5. Neuropathy of left foot    Plan: -Toenails 1-5 b/l were debrided in length and girth with sterile nail nippers and dremel without iatrogenic  bleeding.  -Corns right hallux, right 2nd, left 4th, left 5th digits and calluses submet head 2 left foot were debrided without complication or incident. Total number debrided =5. -We did discuss continuing conservative treatment vs surgical correction of her right foot bunion and right 2nd digit hammertoe. She will think about it. If she would like to proceed, we will refer her to one of the surgeons for surgical consultation for right foot bunion and right 2nd digit hammertoe.  -Patient to continue soft, supportive shoe gear daily. -Patient to report any pedal injuries to medical professional immediately. -Patient/POA to call should there be question/concern in the interim.  Return in about 9 weeks (around 02/12/2020) for nail and callus trim.

## 2019-12-11 NOTE — Patient Instructions (Signed)
Corns and Calluses Corns are small areas of thickened skin that occur on the top, sides, or tip of a toe. They contain a cone-shaped core with a point that can press on a nerve below. This causes pain.  Calluses are areas of thickened skin that can occur anywhere on the body, including the hands, fingers, palms, soles of the feet, and heels. Calluses are usually larger than corns. What are the causes? Corns and calluses are caused by rubbing (friction) or pressure, such as from shoes that are too tight or do not fit properly. What increases the risk? Corns are more likely to develop in people who have misshapen toes (toe deformities), such as hammer toes. Calluses can occur with friction to any area of the skin. They are more likely to develop in people who:  Work with their hands.  Wear shoes that fit poorly, are too tight, or are high-heeled.  Have toe deformities. What are the signs or symptoms? Symptoms of a corn or callus include:  A hard growth on the skin.  Pain or tenderness under the skin.  Redness and swelling.  Increased discomfort while wearing tight-fitting shoes, if your feet are affected. If a corn or callus becomes infected, symptoms may include:  Redness and swelling that gets worse.  Pain.  Fluid, blood, or pus draining from the corn or callus. How is this diagnosed? Corns and calluses may be diagnosed based on your symptoms, your medical history, and a physical exam. How is this treated? Treatment for corns and calluses may include:  Removing the cause of the friction or pressure. This may involve: ? Changing your shoes. ? Wearing shoe inserts (orthotics) or other protective layers in your shoes, such as a corn pad. ? Wearing gloves.  Applying medicine to the skin (topical medicine) to help soften skin in the hardened, thickened areas.  Removing layers of dead skin with a file to reduce the size of the corn or callus.  Removing the corn or callus with a  scalpel or laser.  Taking antibiotic medicines, if your corn or callus is infected.  Having surgery, if a toe deformity is the cause. Follow these instructions at home:   Take over-the-counter and prescription medicines only as told by your health care provider.  If you were prescribed an antibiotic, take it as told by your health care provider. Do not stop taking it even if your condition starts to improve.  Wear shoes that fit well. Avoid wearing high-heeled shoes and shoes that are too tight or too loose.  Wear any padding, protective layers, gloves, or orthotics as told by your health care provider.  Soak your hands or feet and then use a file or pumice stone to soften your corn or callus. Do this as told by your health care provider.  Check your corn or callus every day for symptoms of infection. Contact a health care provider if you:  Notice that your symptoms do not improve with treatment.  Have redness or swelling that gets worse.  Notice that your corn or callus becomes painful.  Have fluid, blood, or pus coming from your corn or callus.  Have new symptoms. Summary  Corns are small areas of thickened skin that occur on the top, sides, or tip of a toe.  Calluses are areas of thickened skin that can occur anywhere on the body, including the hands, fingers, palms, and soles of the feet. Calluses are usually larger than corns.  Corns and calluses are caused by   rubbing (friction) or pressure, such as from shoes that are too tight or do not fit properly.  Treatment may include wearing any padding, protective layers, gloves, or orthotics as told by your health care provider. This information is not intended to replace advice given to you by your health care provider. Make sure you discuss any questions you have with your health care provider. Document Revised: 01/04/2019 Document Reviewed: 07/28/2017 Elsevier Patient Education  2020 [employer]  Onychomycosis/Fungal  Toenails  WHAT IS IT? An infection that lies within the keratin of your nail plate that is caused by a fungus.  WHY ME? Fungal infections affect all ages, sexes, races, and creeds.  There may be many factors that predispose you to a fungal infection such as age, coexisting medical conditions such as diabetes, or an autoimmune disease; stress, medications, fatigue, genetics, etc.  Bottom line: fungus thrives in a warm, moist environment and your shoes offer such a location.  IS IT CONTAGIOUS? Theoretically, yes.  You do not want to share shoes, nail clippers or files with someone who has fungal toenails.  Walking around barefoot in the same room or sleeping in the same bed is unlikely to transfer the organism.  It is important to realize, however, that fungus can spread easily from one nail to the next on the same foot.  HOW DO WE TREAT THIS?  There are several ways to treat this condition.  Treatment may depend on many factors such as age, medications, pregnancy, liver and kidney conditions, etc.  It is best to ask your doctor which options are available to you.  4. No treatment.   Unlike many other medical concerns, you can live with this condition.  However for many people this can be a painful condition and may lead to ingrown toenails or a bacterial infection.  It is recommended that you keep the nails cut short to help reduce the amount of fungal nail. 5. Topical treatment.  These range from herbal remedies to prescription strength nail lacquers.  About 40-50% effective, topicals require twice daily application for approximately 9 to 12 months or until an entirely new nail has grown out.  The most effective topicals are medical grade medications available through physicians offices. 6. Oral antifungal medications.  With an 80-90% cure rate, the most common oral medication requires 3 to 4 months of therapy and stays in your system for a year as the new nail grows out.  Oral antifungal medications do  require blood work to make sure it is a safe drug for you.  A liver function panel will be performed prior to starting the medication and after the first month of treatment.  It is important to have the blood work performed to avoid any harmful side effects.  In general, this medication safe but blood work is required. 7. Laser Therapy.  This treatment is performed by applying a specialized laser to the affected nail plate.  This therapy is noninvasive, fast, and non-painful.  It is not covered by insurance and is therefore, out of pocket.  The results have been very good with a 80-95% cure rate.  [facility] is the only practice in the area to offer this therapy. 8. Permanent Nail Avulsion.  Removing the entire nail so that a new nail will not grow back. 

## 2020-01-31 ENCOUNTER — Ambulatory Visit: Payer: Medicare HMO | Admitting: Podiatry

## 2020-02-19 ENCOUNTER — Other Ambulatory Visit: Payer: Self-pay

## 2020-02-19 ENCOUNTER — Encounter: Payer: Self-pay | Admitting: Podiatry

## 2020-02-19 ENCOUNTER — Ambulatory Visit (INDEPENDENT_AMBULATORY_CARE_PROVIDER_SITE_OTHER): Payer: Medicare HMO | Admitting: Podiatry

## 2020-02-19 DIAGNOSIS — L84 Corns and callosities: Secondary | ICD-10-CM

## 2020-02-19 DIAGNOSIS — B351 Tinea unguium: Secondary | ICD-10-CM

## 2020-02-19 DIAGNOSIS — M79675 Pain in left toe(s): Secondary | ICD-10-CM | POA: Diagnosis not present

## 2020-02-19 DIAGNOSIS — G5792 Unspecified mononeuropathy of left lower limb: Secondary | ICD-10-CM | POA: Diagnosis not present

## 2020-02-19 DIAGNOSIS — M79674 Pain in right toe(s): Secondary | ICD-10-CM | POA: Diagnosis not present

## 2020-02-19 NOTE — Patient Instructions (Signed)

## 2020-02-24 NOTE — Progress Notes (Signed)
Subjective: Amy Richards presents today at risk foot care with history of peripheral neuropathy and painful corn(s) b/l feet and callus(es) b/l feet and painful mycotic nails b/l.  Pain interferes with ambulation. Aggravating factors include wearing enclosed shoe gear.  Bartholome Bill, MD is patient's PCP. Last visit was: 02/08/2020.  Past Medical History:  Diagnosis Date  . Dental crowns present    right upper  . Foot drop, left    wears brace at night  . Hypercholesteremia   . Left leg numbness    s/p accident 2010, numb from knee to foot  . MVP (mitral valve prolapse)    palpatations  . PSVT (paroxysmal supraventricular tachycardia) ([place])      Current Outpatient Medications on File Prior to Visit  Medication Sig Dispense Refill  . aspirin EC 81 MG tablet Take by mouth.    Marland Kitchen atenolol (TENORMIN) 25 MG tablet Take 25 mg by mouth daily. PM    . atorvastatin (LIPITOR) 10 MG tablet Take 10 mg by mouth daily. PM    . bacitracin ophthalmic ointment Use on sutures 4 times a day for 12-14 days 3.5 g 3  . Cholecalciferol (VITAMIN D-3 PO) Take by mouth daily.    . Ciclopirox 0.77 % gel Apply between toes twice daily for 4 weeks. 45 g 0  . Coenzyme Q10 (CO Q10 PO) Take by mouth daily.    . Cranberry 500 MG CAPS Take by mouth.    . loratadine (CLARITIN) 10 MG tablet Take by mouth.    . mineral oil liquid Take by mouth.    . Misc Natural Products (ESTROVEN + ENERGY MAX STRENGTH) TABS     . NON FORMULARY Antifungal Terbinafine 3%; Fluconazole 2% Tea Tree Oil; 5% Urea; 10% Ibuprofen; 2% in DMSO Suspension #30 ML Apply to affected nail(s) once (at bedtime)    . Omega 3-6-9 CAPS Take by mouth daily.    . Omega-3 1000 MG CAPS Take by mouth.    . pravastatin (PRAVACHOL) 20 MG tablet Take by mouth.    . Probiotic Product (PROBIOTIC DAILY PO) Take by mouth.    . TURMERIC PO Take by mouth daily.    Marland Kitchen UNABLE TO FIND Mineral Rich - liquid juice    . UNABLE TO FIND Apricot Seeds - takes 3  a day  Manuka raw honey - one teaspoon a day    . vitamin B-12 (CYANOCOBALAMIN) 500 MCG tablet Take by mouth.     No current facility-administered medications on file prior to visit.     No Known Allergies  Objective: Amy Richards is a pleasant 74 y.o. y.o. Patient Race: Black or African American [2]  female in NAD. AAO x 3.  There were no vitals filed for this visit.  Vascular Examination: Neurovascular status unchanged b/l. Capillary refill time to digits immediate b/l. Palpable DP pulses b/l. Palpable PT pulses b/l. Pedal hair sparse b/l. Skin temperature gradient within normal limits b/l. No edema noted b/l.  Dermatological Examination: Pedal skin with normal turgor, texture and tone bilaterally. No open wounds bilaterally. No interdigital macerations bilaterally. Toenails 1-5 b/l elongated, discolored, dystrophic, thickened, crumbly with subungual debris and tenderness to dorsal palpation. Hyperkeratotic lesion(s) L 5th toe, R hallux, R 2nd toe, submet head 3 left foot and submet head 3 right foot.  No erythema, no edema, no drainage, no flocculence.  Musculoskeletal: Normal muscle strength 5/5 to all lower extremity muscle groups bilaterally. No pain crepitus or joint limitation noted with  ROM b/l. Hallux valgus with bunion deformity noted b/l. Hammertoes noted to the 2-5 bilaterally.  Neurological Examination: Protective sensation diminished with 10g monofilament b/l.  Assessment: 1. Pain due to onychomycosis of toenails of both feet   2. Corns and callosities   3. Neuropathy of left foot   Plan: -Examined patient. -No new findings. No new orders. -Toenails 1-5 b/l were debrided in length and girth with sterile nail nippers and dremel without iatrogenic bleeding.  -Corn(s) L 5th toe, R hallux and R 2nd toe pared utilizing sterile scalpel blade without complication or incident. Total number debrided=3. -Callus(es) submet head 3 left foot and submet head 3 right foot  pared utilizing sterile scalpel blade without complication or incident. Total number debrided =2. -Patient to continue soft, supportive shoe gear daily. -Patient to report any pedal injuries to medical professional immediately. -Patient/POA to call should there be question/concern in the interim.  Return in about 9 weeks (around 04/22/2020) for at risk foot care/, callus trim.  Marzetta Board, DPM

## 2020-03-04 LAB — COLOGUARD: COLOGUARD: NEGATIVE

## 2020-03-04 LAB — EXTERNAL GENERIC LAB PROCEDURE: COLOGUARD: NEGATIVE

## 2020-04-22 ENCOUNTER — Other Ambulatory Visit: Payer: Self-pay

## 2020-04-22 ENCOUNTER — Ambulatory Visit: Payer: Medicare HMO | Admitting: Podiatry

## 2020-04-22 ENCOUNTER — Encounter: Payer: Self-pay | Admitting: Podiatry

## 2020-04-22 DIAGNOSIS — M79674 Pain in right toe(s): Secondary | ICD-10-CM

## 2020-04-22 DIAGNOSIS — M79675 Pain in left toe(s): Secondary | ICD-10-CM

## 2020-04-22 DIAGNOSIS — L84 Corns and callosities: Secondary | ICD-10-CM | POA: Diagnosis not present

## 2020-04-22 DIAGNOSIS — B351 Tinea unguium: Secondary | ICD-10-CM | POA: Diagnosis not present

## 2020-04-22 DIAGNOSIS — G5792 Unspecified mononeuropathy of left lower limb: Secondary | ICD-10-CM | POA: Diagnosis not present

## 2020-04-24 NOTE — Progress Notes (Signed)
Subjective: Amy Richards presents today at risk foot care with history of peripheral neuropathy and painful corn(s) b/l feet and callus(es) b/l feet and painful mycotic nails b/l.  Pain interferes with ambulation. Aggravating factors include wearing enclosed shoe gear.   Pt states she has been filing corn on right 2nd digit and it feels better. She voices no new pedal problems on today's visit.  Bartholome Bill, MD is patient's PCP. Last visit was: 02/08/2020.  Past Medical History:  Diagnosis Date  . Dental crowns present    right upper  . Foot drop, left    wears brace at night  . Hypercholesteremia   . Left leg numbness    s/p accident 2010, numb from knee to foot  . MVP (mitral valve prolapse)    palpatations  . PSVT (paroxysmal supraventricular tachycardia) ([employer])      Current Outpatient Medications on File Prior to Visit  Medication Sig Dispense Refill  . aspirin EC 81 MG tablet Take by mouth.    Marland Kitchen atenolol (TENORMIN) 25 MG tablet Take 25 mg by mouth daily. PM    . atorvastatin (LIPITOR) 10 MG tablet Take 10 mg by mouth daily. PM    . bacitracin ophthalmic ointment Use on sutures 4 times a day for 12-14 days 3.5 g 3  . Cholecalciferol (VITAMIN D-3 PO) Take by mouth daily.    . Ciclopirox 0.77 % gel Apply between toes twice daily for 4 weeks. 45 g 0  . Coenzyme Q10 (CO Q10 PO) Take by mouth daily.    . Cranberry 500 MG CAPS Take by mouth.    . loratadine (CLARITIN) 10 MG tablet Take by mouth.    . mineral oil liquid Take by mouth.    . Misc Natural Products (ESTROVEN + ENERGY MAX STRENGTH) TABS     . NON FORMULARY Antifungal Terbinafine 3%; Fluconazole 2% Tea Tree Oil; 5% Urea; 10% Ibuprofen; 2% in DMSO Suspension #30 ML Apply to affected nail(s) once (at bedtime)    . Omega 3-6-9 CAPS Take by mouth daily.    . Omega-3 1000 MG CAPS Take by mouth.    . pravastatin (PRAVACHOL) 20 MG tablet Take by mouth.    . Probiotic Product (PROBIOTIC DAILY PO) Take by mouth.     . TURMERIC PO Take by mouth daily.    Marland Kitchen UNABLE TO FIND Mineral Rich - liquid juice    . UNABLE TO FIND Apricot Seeds - takes 3 a day  Manuka raw honey - one teaspoon a day    . vitamin B-12 (CYANOCOBALAMIN) 500 MCG tablet Take by mouth.     No current facility-administered medications on file prior to visit.     No Known Allergies  Objective: Amy Richards is a pleasant 74 y.o. African American female in NAD.  AAO x 3.  There were no vitals filed for this visit.  Vascular Examination: Neurovascular status unchanged b/l. Capillary refill time to digits immediate b/l. Palpable DP pulses b/l. Palpable PT pulses b/l. Pedal hair sparse b/l. Skin temperature gradient within normal limits b/l. No edema noted b/l.  Dermatological Examination: Pedal skin with normal turgor, texture and tone bilaterally. No open wounds bilaterally. No interdigital macerations bilaterally. Toenails 1-5 b/l elongated, discolored, dystrophic, thickened, crumbly with subungual debris and tenderness to dorsal palpation. Hyperkeratotic lesion(s) L 5th toe, R hallux, submet head 3 left foot and submet head 3 right foot.  No erythema, no edema, no drainage, no flocculence.  Musculoskeletal: Normal muscle  strength 5/5 to all lower extremity muscle groups bilaterally. No pain crepitus or joint limitation noted with ROM b/l. Hallux valgus with bunion deformity noted b/l. Hammertoes noted to the 2-5 bilaterally.  Neurological Examination: Protective sensation diminished with 10g monofilament b/l.  Assessment: 1. Pain due to onychomycosis of toenails of both feet   2. Corns and callosities   3. Neuropathy of left foot     Plan: -Examined patient. -No new findings. No new orders. -Toenails 1-5 b/l were debrided in length and girth with sterile nail nippers and dremel without iatrogenic bleeding.  -Corn(s) L 5th toe, R hallux and pared utilizing sterile scalpel blade without complication or incident. Total number  debrided=2. -Callus(es) submet head 3 left foot and submet head 3 right foot pared utilizing sterile scalpel blade without complication or incident. Total number debrided =2. -Patient to continue soft, supportive shoe gear daily. -Patient to report any pedal injuries to medical professional immediately. -Patient/POA to call should there be question/concern in the interim.  Return in about 9 weeks (around 06/24/2020) for callus trim.  Marzetta Board, DPM

## 2020-07-05 ENCOUNTER — Encounter: Payer: Self-pay | Admitting: Podiatry

## 2020-07-05 ENCOUNTER — Other Ambulatory Visit: Payer: Self-pay

## 2020-07-05 ENCOUNTER — Ambulatory Visit: Payer: Medicare HMO | Admitting: Podiatry

## 2020-07-05 DIAGNOSIS — I872 Venous insufficiency (chronic) (peripheral): Secondary | ICD-10-CM

## 2020-07-05 DIAGNOSIS — L84 Corns and callosities: Secondary | ICD-10-CM | POA: Diagnosis not present

## 2020-07-05 DIAGNOSIS — B351 Tinea unguium: Secondary | ICD-10-CM

## 2020-07-05 DIAGNOSIS — M79675 Pain in left toe(s): Secondary | ICD-10-CM

## 2020-07-05 DIAGNOSIS — M79674 Pain in right toe(s): Secondary | ICD-10-CM

## 2020-07-05 DIAGNOSIS — G5792 Unspecified mononeuropathy of left lower limb: Secondary | ICD-10-CM

## 2020-07-05 DIAGNOSIS — R600 Localized edema: Secondary | ICD-10-CM

## 2020-07-05 NOTE — Patient Instructions (Signed)
Chronic Venous Insufficiency Chronic venous insufficiency is a condition where the leg veins cannot effectively pump blood from the legs to the heart. This happens when the vein walls are either stretched, weakened, or damaged, or when the valves inside the vein are damaged. With the right treatment, you should be able to continue with an active life. This condition is also called venous stasis. What are the causes? Common causes of this condition include:  High blood pressure inside the veins (venous hypertension).  Sitting or standing too long, causing increased blood pressure in the leg veins.  A blood clot that blocks blood flow in a vein (deep vein thrombosis, DVT).  Inflammation of a vein (phlebitis) that causes a blood clot to form.  Tumors in the pelvis that cause blood to back up. What increases the risk? The following factors may make you more likely to develop this condition:  Having a family history of this condition.  Obesity.  Pregnancy.  Living without enough regular physical activity or exercise (sedentary lifestyle).  Smoking.  Having a job that requires long periods of standing or sitting in one place.  Being a certain age. Women in their 40s and 50s and men in their 70s are more likely to develop this condition. What are the signs or symptoms? Symptoms of this condition include:  Veins that are enlarged, bulging, or twisted (varicose veins).  Skin breakdown or ulcers.  Reddened skin or dark discoloration of skin on the leg between the knee and ankle.  Brown, smooth, tight, and painful skin just above the ankle, usually on the inside of the leg (lipodermatosclerosis).  Swelling of the legs. How is this diagnosed? This condition may be diagnosed based on:  Your medical history.  A physical exam.  Tests, such as: ? A procedure that creates an image of a blood vessel and nearby organs and provides information about blood flow through the blood vessel  (duplex ultrasound). ? A procedure that tests blood flow (plethysmography). ? A procedure that looks at the veins using X-ray and dye (venogram). How is this treated? The goals of treatment are to help you return to an active life and to minimize pain or disability. Treatment depends on the severity of your condition, and it may include:  Wearing compression stockings. These can help relieve symptoms and help prevent your condition from getting worse. However, they do not cure the condition.  Sclerotherapy. This procedure involves an injection of a solution that shrinks damaged veins.  Surgery. This may involve: ? Removing a diseased vein (vein stripping). ? Cutting off blood flow through the vein (laser ablation surgery). ? Repairing or reconstructing a valve within the affected vein. Follow these instructions at home:      Wear compression stockings as told by your health care provider. These stockings help to prevent blood clots and reduce swelling in your legs.  Take over-the-counter and prescription medicines only as told by your health care provider.  Stay active by exercising, walking, or doing different activities. Ask your health care provider what activities are safe for you and how much exercise you need.  Drink enough fluid to keep your urine pale yellow.  Do not use any products that contain nicotine or tobacco, such as cigarettes, e-cigarettes, and chewing tobacco. If you need help quitting, ask your health care provider.  Keep all follow-up visits as told by your health care provider. This is important. Contact a health care provider if you:  Have redness, swelling, or more pain   in the affected area.  See a red streak or line that goes up or down from the affected area.  Have skin breakdown or skin loss in the affected area, even if the breakdown is small.  Get an injury in the affected area. Get help right away if:  You get an injury and an open wound in the  affected area.  You have: ? Severe pain that does not get better with medicine. ? Sudden numbness or weakness in the foot or ankle below the affected area. ? Trouble moving your foot or ankle. ? A fever. ? Worse or persistent symptoms. ? Chest pain. ? Shortness of breath. Summary  Chronic venous insufficiency is a condition where the leg veins cannot effectively pump blood from the legs to the heart.  Chronic venous insufficiency occurs when the vein walls become stretched, weakened, or damaged, or when valves within the vein are damaged.  Treatment depends on how severe your condition is. It often involves wearing compression stockings and may involve having a procedure.  Make sure you stay active by exercising, walking, or doing different activities. Ask your health care provider what activities are safe for you and how much exercise you need. This information is not intended to replace advice given to you by your health care provider. Make sure you discuss any questions you have with your health care provider. Document Revised: 06/07/2018 Document Reviewed: 06/07/2018 Elsevier Patient Education  [place].   Varicose Veins Varicose veins are veins that have become enlarged, bulged, and twisted. They most often appear in the legs. What are the causes? This condition is caused by damage to the valves in the vein. These valves help blood return to your heart. When they are damaged and they stop working properly, blood may flow backward and back up in the veins near the skin, causing the veins to get larger and appear twisted. The condition can result from any issue that causes blood to back up, like pregnancy, prolonged standing, or obesity. What increases the risk? This condition is more likely to develop in people who are:  On their feet a lot.  Pregnant.  Overweight. What are the signs or symptoms? Symptoms of this condition include:  Bulging, twisted, and bluish  veins.  A feeling of heaviness. This may be worse at the end of the day.  Leg pain. This may be worse at the end of the day.  Swelling in the leg.  Changes in skin color over the veins. How is this diagnosed? This condition may be diagnosed based on your symptoms, a physical exam, and an ultrasound test. How is this treated? Treatment for this condition may involve:  Avoiding sitting or standing in one position for long periods of time.  Wearing compression stockings. These stockings help to prevent blood clots and reduce swelling in the legs.  Raising (elevating) the legs when resting.  Losing weight.  Exercising regularly. If you have persistent symptoms or want to improve the way your varicose veins look, you may choose to have a procedure to close the varicose veins off or to remove them. Treatments to close off the veins include:  Sclerotherapy. In this treatment, a solution is injected into a vein to close it off.  Laser treatment. In this treatment, the vein is heated with a laser to close it off.  Radiofrequency vein ablation. In this treatment, an electrical current produced by radio waves is used to close off the vein. Treatments to remove the  veins include:  Phlebectomy. In this treatment, the veins are removed through small incisions made over the veins.  Vein ligation and stripping. In this treatment, incisions are made over the veins. The veins are then removed after being tied (ligated) with stitches (sutures). Follow these instructions at home: Activity  Walk as much as possible. Walking increases blood flow. This helps blood return to the heart and takes pressure off your veins. It also increases your cardiovascular strength.  Follow your health care provider's instructions about exercising.  Do not stand or sit in one position for a long period of time.  Do not sit with your legs crossed.  Rest with your legs raised during the day. General  instructions   Follow any diet instructions given to you by your health care provider.  Wear compression stockings as directed by your health care provider. Do not wear other kinds of tight clothing around your legs, pelvis, or waist.  Elevate your legs at night to above the level of your heart.  If you get a cut in the skin over the varicose vein and the vein bleeds: ? Lie down with your leg raised. ? Apply firm pressure to the cut with a clean cloth until the bleeding stops. ? Place a bandage (dressing) on the cut. Contact a health care provider if:  The skin around your varicose veins starts to break down.  You have pain, redness, tenderness, or hard swelling over a vein.  You are uncomfortable because of pain.  You get a cut in the skin over a varicose vein and it will not stop bleeding. Summary  Varicose veins are veins that have become enlarged, bulged, and twisted. They most often appear in the legs.  This condition is caused by damage to the valves in the vein. These valves help blood return to your heart.  Treatment for this condition includes frequent movements, wearing compression stockings, losing weight, and exercising regularly. In some cases, procedures are done to close off or remove the veins.  Treatment for this condition may include wearing compression stockings, elevating the legs, losing weight, and engaging in regular activity. In some cases, procedures are done to close off or remove the veins. This information is not intended to replace advice given to you by your health care provider. Make sure you discuss any questions you have with your health care provider. Document Revised: 11/10/2018 Document Reviewed: 10/07/2016 Elsevier Patient Education  [place].  Edema  Edema is an abnormal buildup of fluids in the body tissues and under the skin. Swelling of the legs, feet, and ankles is a common symptom that becomes more likely as you get older.  Swelling is also common in looser tissues, like around the eyes. When the affected area is squeezed, the fluid may move out of that spot and leave a dent for a few moments. This dent is called pitting edema. There are many possible causes of edema. Eating too much salt (sodium) and being on your feet or sitting for a long time can cause edema in your legs, feet, and ankles. Hot weather may make edema worse. Common causes of edema include:  Heart failure.  Liver or kidney disease.  Weak leg blood vessels.  Cancer.  An injury.  Pregnancy.  Medicines.  Being obese.  Low protein levels in the blood. Edema is usually painless. Your skin may look swollen or shiny. Follow these instructions at home:  Keep the affected body part raised (elevated) above the level  of your heart when you are sitting or lying down.  Do not sit still or stand for long periods of time.  Do not wear tight clothing. Do not wear garters on your upper legs.  Exercise your legs to get your circulation going. This helps to move the fluid back into your blood vessels, and it may help the swelling go down.  Wear elastic bandages or support stockings to reduce swelling as told by your health care provider.  Eat a low-salt (low-sodium) diet to reduce fluid as told by your health care provider.  Depending on the cause of your swelling, you may need to limit how much fluid you drink (fluid restriction).  Take over-the-counter and prescription medicines only as told by your health care provider. Contact a health care provider if:  Your edema does not get better with treatment.  You have heart, liver, or kidney disease and have symptoms of edema.  You have sudden and unexplained weight gain. Get help right away if:  You develop shortness of breath or chest pain.  You cannot breathe when you lie down.  You develop pain, redness, or warmth in the swollen areas.  You have heart, liver, or kidney disease and  suddenly get edema.  You have a fever and your symptoms suddenly get worse. Summary  Edema is an abnormal buildup of fluids in the body tissues and under the skin.  Eating too much salt (sodium) and being on your feet or sitting for a long time can cause edema in your legs, feet, and ankles.  Keep the affected body part raised (elevated) above the level of your heart when you are sitting or lying down. This information is not intended to replace advice given to you by your health care provider. Make sure you discuss any questions you have with your health care provider. Document Revised: 02/01/2019 Document Reviewed: 10/17/2016 Elsevier Patient Education  [place].

## 2020-07-07 NOTE — Progress Notes (Signed)
Subjective: Amy Richards presents today at risk foot care with history of peripheral neuropathy and painful corn(s) b/l feet and callus(es) b/l feet and painful mycotic nails b/l.  Pain interferes with ambulation. Aggravating factors include wearing enclosed shoe gear.   Pt states she feels like 9 weeks is too long between visits. She has questions regarding b/l LE swelling of her feet and ankles.  Amy Bill, MD is patient's PCP. Last visit was: 05/23/2020.  Past Medical History:  Diagnosis Date  . Dental crowns present    right upper  . Foot drop, left    wears brace at night  . Hypercholesteremia   . Left leg numbness    s/p accident 2010, numb from knee to foot  . MVP (mitral valve prolapse)    palpatations  . PSVT (paroxysmal supraventricular tachycardia) ([place])      Current Outpatient Medications on File Prior to Visit  Medication Sig Dispense Refill  . aspirin EC 81 MG tablet Take by mouth.    Marland Kitchen atenolol (TENORMIN) 25 MG tablet Take 25 mg by mouth daily. PM    . atorvastatin (LIPITOR) 10 MG tablet Take 10 mg by mouth daily. PM    . bacitracin ophthalmic ointment Use on sutures 4 times a day for 12-14 days 3.5 g 3  . Cholecalciferol (VITAMIN D-3 PO) Take by mouth daily.    . Ciclopirox 0.77 % gel Apply between toes twice daily for 4 weeks. 45 g 0  . Coenzyme Q10 (CO Q10 PO) Take by mouth daily.    . Cranberry 500 MG CAPS Take by mouth.    . loratadine (CLARITIN) 10 MG tablet Take by mouth.    . mineral oil liquid Take by mouth.    . Misc Natural Products (ESTROVEN + ENERGY MAX STRENGTH) TABS     . NON FORMULARY Antifungal Terbinafine 3%; Fluconazole 2% Tea Tree Oil; 5% Urea; 10% Ibuprofen; 2% in DMSO Suspension #30 ML Apply to affected nail(s) once (at bedtime)    . Omega 3-6-9 CAPS Take by mouth daily.    . Omega-3 1000 MG CAPS Take by mouth.    . pravastatin (PRAVACHOL) 20 MG tablet Take by mouth.    . Probiotic Product (PROBIOTIC DAILY PO) Take by mouth.     . TURMERIC PO Take by mouth daily.    Marland Kitchen UNABLE TO FIND Mineral Rich - liquid juice    . UNABLE TO FIND Apricot Seeds - takes 3 a day  Manuka raw honey - one teaspoon a day    . vitamin B-12 (CYANOCOBALAMIN) 500 MCG tablet Take by mouth.     No current facility-administered medications on file prior to visit.     No Known Allergies  Objective: Amy Richards is a pleasant 74 y.o. African American female in NAD.  AAO x 3.  There were no vitals filed for this visit.  Vascular Examination: Neurovascular status unchanged b/l. Capillary refill time to digits immediate b/l. Palpable DP pulses b/l. Palpable PT pulses b/l. Pedal hair sparse b/l. Skin temperature gradient within normal limits b/l. No edema noted b/l.  Dermatological Examination: Pedal skin with normal turgor, texture and tone bilaterally. No open wounds bilaterally. No interdigital macerations bilaterally. Toenails 1-5 b/l elongated, discolored, dystrophic, thickened, crumbly with subungual debris and tenderness to dorsal palpation. Hyperkeratotic lesion(s) L 5th toe, R hallux, submet head 3 left foot and submet head 3 right foot.  No erythema, no edema, no drainage, no flocculence.  Musculoskeletal: Normal muscle  strength 5/5 to all lower extremity muscle groups bilaterally. No pain crepitus or joint limitation noted with ROM b/l. Hallux valgus with bunion deformity noted b/l. Hammertoes noted to the 2-5 bilaterally.  Neurological Examination: Protective sensation diminished with 10g monofilament b/l.  Assessment: 1. Pain due to onychomycosis of toenails of both feet   2. Corns and callosities   3. Localized edema   4. Neuropathy of left foot   5. Venous (peripheral) insufficiency     Plan: -Examined patient. -Discussed edema, venous insufficiency and varicose veins. Discussed use of compression hose. I think she can benefit from this provided it is okay with her Cardiologist. Dispensed written information to Mrs.  Richards today. -Toenails 1-5 b/l were debrided in length and girth with sterile nail nippers and dremel without iatrogenic bleeding.  -Corn(s) L 5th toe, R hallux and pared utilizing sterile scalpel blade without complication or incident. Total number debrided=2. -Callus(es) submet head 3 left foot and submet head 3 right foot pared utilizing sterile scalpel blade without complication or incident. Total number debrided =2. -Patient to continue soft, supportive shoe gear daily. -Patient to report any pedal injuries to medical professional immediately. -Patient/POA to call should there be question/concern in the interim.  Return in about 9 weeks (around 09/06/2020).  Marzetta Board, DPM

## 2020-07-19 DIAGNOSIS — D259 Leiomyoma of uterus, unspecified: Secondary | ICD-10-CM | POA: Insufficient documentation

## 2020-07-19 DIAGNOSIS — N838 Other noninflammatory disorders of ovary, fallopian tube and broad ligament: Secondary | ICD-10-CM | POA: Insufficient documentation

## 2020-07-19 DIAGNOSIS — N852 Hypertrophy of uterus: Secondary | ICD-10-CM | POA: Insufficient documentation

## 2020-07-26 DIAGNOSIS — R600 Localized edema: Secondary | ICD-10-CM | POA: Insufficient documentation

## 2020-10-01 ENCOUNTER — Ambulatory Visit (INDEPENDENT_AMBULATORY_CARE_PROVIDER_SITE_OTHER): Payer: Medicare HMO | Admitting: Podiatry

## 2020-10-01 ENCOUNTER — Other Ambulatory Visit: Payer: Self-pay

## 2020-10-01 DIAGNOSIS — M79674 Pain in right toe(s): Secondary | ICD-10-CM | POA: Diagnosis not present

## 2020-10-01 DIAGNOSIS — M79675 Pain in left toe(s): Secondary | ICD-10-CM

## 2020-10-01 DIAGNOSIS — B351 Tinea unguium: Secondary | ICD-10-CM | POA: Diagnosis not present

## 2020-10-01 DIAGNOSIS — L84 Corns and callosities: Secondary | ICD-10-CM

## 2020-10-01 DIAGNOSIS — G5792 Unspecified mononeuropathy of left lower limb: Secondary | ICD-10-CM

## 2020-10-01 DIAGNOSIS — M2012 Hallux valgus (acquired), left foot: Secondary | ICD-10-CM

## 2020-10-01 DIAGNOSIS — M2011 Hallux valgus (acquired), right foot: Secondary | ICD-10-CM

## 2020-10-01 NOTE — Patient Instructions (Signed)
Spray Lotrimin Antifungal Deoderant Powder Spray (over the counter). Spray as directed between toes until peeling/moisture resolves.   Athlete's Foot  Athlete's foot (tinea pedis) is a fungal infection of the skin on your feet. It often occurs on the skin that is between or underneath the toes. It can also occur on the soles of your feet. The infection can spread from person to person (is contagious). It can also spread when a person's bare feet come in contact with the fungus on shower floors or on items such as shoes. What are the causes? This condition is caused by a fungus that grows in warm, moist places. You can get athlete's foot by sharing shoes, shower stalls, towels, and wet floors with someone who is infected. Not washing your feet or changing your socks often enough can also lead to athlete's foot. What increases the risk? This condition is more likely to develop in:  Men.  People who have a weak body defense system (immune system).  People who have diabetes.  People who use public showers, such as at a gym.  People who wear heavy-duty shoes, such as Environmental manager.  Seasons with warm, humid weather. What are the signs or symptoms? Symptoms of this condition include:  Itchy areas between your toes or on the soles of your feet.  White, flaky, or scaly areas between your toes or on the soles of your feet.  Very itchy small blisters between your toes or on the soles of your feet.  Small cuts in your skin. These cuts can become infected.  Thick or discolored toenails. How is this diagnosed? This condition may be diagnosed with a physical exam and a review of your medical history. Your health care provider may also take a skin or toenail sample to examine under a microscope. How is this treated? This condition is treated with antifungal medicines. These may be applied as powders, ointments, or creams. In severe cases, an oral antifungal medicine may be  given. Follow these instructions at home: Medicines  Apply or take over-the-counter and prescription medicines only as told by your health care provider.  Apply your antifungal medicine as told by your health care provider. Do not stop using the antifungal even if your condition improves. Foot care  Do not scratch your feet.  Keep your feet dry: ? Wear cotton or wool socks. Change your socks every day or if they become wet. ? Wear shoes that allow air to flow, such as sandals or canvas tennis shoes.  Wash and dry your feet, including the area between your toes. Also, wash and dry your feet: ? Every day or as told by your health care provider. ? After exercising. General instructions  Do not let others use towels, shoes, nail clippers, or other personal items that touch your feet.  Protect your feet by wearing sandals in wet areas, such as locker rooms and shared showers.  Keep all follow-up visits as told by your health care provider. This is important.  If you have diabetes, keep your blood sugar under control. Contact a health care provider if:  You have a fever.  You have swelling, soreness, warmth, or redness in your foot.  Your feet are not getting better with treatment.  Your symptoms get worse.  You have new symptoms. Summary  Athlete's foot (tinea pedis) is a fungal infection of the skin on your feet. It often occurs on skin that is between or underneath the toes.  This condition is  caused by a fungus that grows in warm, moist places.  Symptoms include white, flaky, or scaly areas between your toes or on the soles of your feet.  This condition is treated with antifungal medicines.  Keep your feet clean. Always dry them thoroughly. This information is not intended to replace advice given to you by your health care provider. Make sure you discuss any questions you have with your health care provider. Document Revised: 09/09/2017 Document Reviewed:  07/05/2017 Elsevier Patient Education  2020 [employer].

## 2020-10-02 DIAGNOSIS — C569 Malignant neoplasm of unspecified ovary: Secondary | ICD-10-CM | POA: Insufficient documentation

## 2020-10-03 ENCOUNTER — Encounter: Payer: Self-pay | Admitting: Podiatry

## 2020-10-03 NOTE — Progress Notes (Signed)
Subjective: Amy Richards presents today at risk foot care with history of peripheral neuropathy and painful corn(s) b/l feet and callus(es) b/l feet and painful mycotic nails b/l.  Pain interferes with ambulation. Aggravating factors include wearing enclosed shoe gear.   Pt states she has been diagnosed with ovarian cancer since her last visit. She has had a hysterectomy. She will be having a second opinion at [facility] today regarding need for chemotherapy.  Verlon Au, MD is patient's PCP. Last visit was: 05/23/2020.  Past Medical History:  Diagnosis Date  . Dental crowns present    right upper  . Foot drop, left    wears brace at night  . Hypercholesteremia   . Left leg numbness    s/p accident 2010, numb from knee to foot  . MVP (mitral valve prolapse)    palpatations  . PSVT (paroxysmal supraventricular tachycardia) ([facility])      Current Outpatient Medications on File Prior to Visit  Medication Sig Dispense Refill  . ascorbic acid (VITAMIN C) 1000 MG tablet Take by mouth.    Marland Kitchen aspirin EC 81 MG tablet Take by mouth.    Marland Kitchen atenolol (TENORMIN) 25 MG tablet Take 25 mg by mouth daily. PM    . atorvastatin (LIPITOR) 10 MG tablet Take 10 mg by mouth daily. PM    . bacitracin ophthalmic ointment Use on sutures 4 times a day for 12-14 days 3.5 g 3  . Cholecalciferol (VITAMIN D-3 PO) Take by mouth daily.    . Ciclopirox 0.77 % gel Apply between toes twice daily for 4 weeks. 45 g 0  . Coenzyme Q10 (CO Q10 PO) Take by mouth daily.    . Cranberry 500 MG CAPS Take by mouth.    . ferrous sulfate 325 (65 FE) MG tablet Take by mouth.    . furosemide (LASIX) 20 MG tablet Take 20 mg by mouth 2 (two) times daily as needed.    Marland Kitchen KLOR-CON M20 20 MEQ tablet Take by mouth.    . loratadine (CLARITIN) 10 MG tablet Take by mouth.    . mineral oil liquid Take by mouth.    . Misc Natural Products (ESTROVEN + ENERGY MAX STRENGTH) TABS     . NON FORMULARY Antifungal Terbinafine 3%;  Fluconazole 2% Tea Tree Oil; 5% Urea; 10% Ibuprofen; 2% in DMSO Suspension #30 ML Apply to affected nail(s) once (at bedtime)    . Omega 3-6-9 CAPS Take by mouth daily.    . Omega-3 1000 MG CAPS Take by mouth.    . pravastatin (PRAVACHOL) 20 MG tablet Take by mouth.    . Probiotic Product (PROBIOTIC DAILY PO) Take by mouth.    . senna-docusate (SENOKOT-S) 8.6-50 MG tablet Take 1 tablet by mouth daily.    . TURMERIC PO Take by mouth daily.    Marland Kitchen UNABLE TO FIND Mineral Rich - liquid juice    . UNABLE TO FIND Apricot Seeds - takes 3 a day  Manuka raw honey - one teaspoon a day    . vitamin B-12 (CYANOCOBALAMIN) 500 MCG tablet Take by mouth.    . zinc gluconate 50 MG tablet Take 1 tablet by mouth daily.     No current facility-administered medications on file prior to visit.     No Known Allergies  Objective: Amy Richards is a pleasant 75 y.o. African American female in NAD.  AAO x 3.  There were no vitals filed for this visit.  Vascular Examination: Neurovascular status  unchanged b/l. Capillary refill time to digits immediate b/l. Palpable DP pulses b/l. Palpable PT pulses b/l. Pedal hair sparse b/l. Skin temperature gradient within normal limits b/l. No edema noted b/l.  Dermatological Examination: Pedal skin with normal turgor, texture and tone bilaterally. No open wounds bilaterally. No interdigital macerations bilaterally. Toenails 1-5 b/l elongated, discolored, dystrophic, thickened, crumbly with subungual debris and tenderness to dorsal palpation. Hyperkeratotic lesion(s) L 5th toe, R hallux, submet head 3 left foot and submet head 3 right foot.  No erythema, no edema, no drainage, no flocculence.  Musculoskeletal: Muscle strength 5/5 to all LE muscle groups of right lower extremity. Muscle strength 4/5to all LE muscle groups of left lower extremity. Dropfoot left lower extremity. No pain crepitus or joint limitation noted with ROM b/l. Hallux valgus with bunion deformity noted  b/l lower extremities. Hammertoes noted to the 2-5 bilaterally. Patient ambulates independent of any assistive aids.  Neurological Examination: Protective sensation intact with 10 gram monofilament right lower extremity. Protective sensation diminished with 10 gram monofilament left lower extremity.  Assessment: 1. Pain due to onychomycosis of toenails of both feet   2. Corns and callosities   3. Hallux valgus, acquired, bilateral   4. Neuropathy of left foot     Plan: -Examined patient. -Toenails 1-5 b/l were debrided in length and girth with sterile nail nippers and dremel without iatrogenic bleeding.  -Corn(s) L 5th toe, R hallux and pared utilizing sterile scalpel blade without complication or incident. Total number debrided=2. -Callus(es) submet head 3 left foot and submet head 3 right foot pared utilizing sterile scalpel blade without complication or incident. Total number debrided =2. -Patient to continue soft, supportive shoe gear daily. -Patient to report any pedal injuries to medical professional immediately. -Patient/POA to call should there be question/concern in the interim.  Return in about 3 months (around 12/30/2020).  Marzetta Board, DPM

## 2020-10-09 DIAGNOSIS — Z8543 Personal history of malignant neoplasm of ovary: Secondary | ICD-10-CM | POA: Insufficient documentation

## 2020-12-30 ENCOUNTER — Other Ambulatory Visit: Payer: Self-pay

## 2020-12-30 ENCOUNTER — Ambulatory Visit (INDEPENDENT_AMBULATORY_CARE_PROVIDER_SITE_OTHER): Payer: Medicare HMO | Admitting: Podiatry

## 2020-12-30 DIAGNOSIS — M2012 Hallux valgus (acquired), left foot: Secondary | ICD-10-CM

## 2020-12-30 DIAGNOSIS — L84 Corns and callosities: Secondary | ICD-10-CM | POA: Diagnosis not present

## 2020-12-30 DIAGNOSIS — B351 Tinea unguium: Secondary | ICD-10-CM

## 2020-12-30 DIAGNOSIS — G5792 Unspecified mononeuropathy of left lower limb: Secondary | ICD-10-CM

## 2020-12-30 DIAGNOSIS — M79675 Pain in left toe(s): Secondary | ICD-10-CM | POA: Diagnosis not present

## 2020-12-30 DIAGNOSIS — M79674 Pain in right toe(s): Secondary | ICD-10-CM | POA: Diagnosis not present

## 2020-12-30 DIAGNOSIS — M2011 Hallux valgus (acquired), right foot: Secondary | ICD-10-CM

## 2021-01-05 ENCOUNTER — Encounter: Payer: Self-pay | Admitting: Podiatry

## 2021-01-05 NOTE — Progress Notes (Signed)
Subjective: Amy Richards presents today at risk foot care with history of peripheral neuropathy and painful corn(s) b/l feet and callus(es) b/l feet and painful mycotic nails b/l.  Pain interferes with ambulation. Aggravating factors include wearing enclosed shoe gear.   Pt states no new pedal problems on today's visit.  Amy Bill, MD is patient's PCP. Last visit was: 12/25/2020.  No Known Allergies  Objective: Amy Richards is a pleasant 75 y.o. African American female in NAD.  AAO x 3.  There were no vitals filed for this visit.  Vascular Examination: Neurovascular status unchanged b/l. Capillary refill time to digits immediate b/l. Palpable DP pulses b/l. Palpable PT pulses b/l. Pedal hair sparse b/l. Skin temperature gradient within normal limits b/l. No edema noted b/l.  Dermatological Examination: Pedal skin with normal turgor, texture and tone bilaterally. No open wounds bilaterally. No interdigital macerations bilaterally. Toenails 1-5 b/l elongated, discolored, dystrophic, thickened, crumbly with subungual debris and tenderness to dorsal palpation. Hyperkeratotic lesion(s) L 5th toe, R hallux, submet head 3 left foot and submet head 3 right foot.  No erythema, no edema, no drainage, no flocculence.  Musculoskeletal: Muscle strength 5/5 to all LE muscle groups of right lower extremity. Muscle strength 4/5to all LE muscle groups of left lower extremity. Dropfoot left lower extremity. No pain crepitus or joint limitation noted with ROM b/l. Hallux valgus with bunion deformity noted b/l lower extremities. Hammertoes noted to the 2-5 bilaterally. Patient ambulates independent of any assistive aids.  Neurological Examination: Protective sensation intact with 10 gram monofilament right lower extremity. Protective sensation diminished with 10 gram monofilament left lower extremity.  Assessment: 1. Pain due to onychomycosis of toenails of both feet   2. Corns and  callosities   3. Hallux valgus, acquired, bilateral   4. Neuropathy of left foot     Plan: -Examined patient. -Toenails 1-5 b/l were debrided in length and girth with sterile nail nippers and dremel without iatrogenic bleeding.  -Corn(s) L 5th toe, R hallux and pared utilizing sterile scalpel blade without complication or incident. Total number debrided=2. -Callus(es) submet head 3 left foot and submet head 3 right foot pared utilizing sterile scalpel blade without complication or incident. Total number debrided =2. -Patient to continue soft, supportive shoe gear daily. -Patient to report any pedal injuries to medical professional immediately. -Patient/POA to call should there be question/concern in the interim.  Return in about 3 months (around 03/31/2021).  Marzetta Board, DPM

## 2021-01-23 DIAGNOSIS — M85852 Other specified disorders of bone density and structure, left thigh: Secondary | ICD-10-CM | POA: Insufficient documentation

## 2021-04-11 ENCOUNTER — Ambulatory Visit: Payer: Medicare HMO | Admitting: Podiatry

## 2021-04-11 ENCOUNTER — Other Ambulatory Visit: Payer: Self-pay

## 2021-04-11 DIAGNOSIS — L84 Corns and callosities: Secondary | ICD-10-CM

## 2021-04-11 DIAGNOSIS — M79674 Pain in right toe(s): Secondary | ICD-10-CM

## 2021-04-11 DIAGNOSIS — M79675 Pain in left toe(s): Secondary | ICD-10-CM | POA: Diagnosis not present

## 2021-04-11 DIAGNOSIS — G5792 Unspecified mononeuropathy of left lower limb: Secondary | ICD-10-CM

## 2021-04-11 DIAGNOSIS — B351 Tinea unguium: Secondary | ICD-10-CM

## 2021-04-13 ENCOUNTER — Encounter: Payer: Self-pay | Admitting: Podiatry

## 2021-04-13 NOTE — Progress Notes (Signed)
Subjective: Amy Richards is a pleasant 75 y.o. female patient seen today with h/o peripheral neuropathy. She has painful corns b/l and painful thick toenails that are difficult to trim. Pain interferes with ambulation. Aggravating factors include wearing enclosed shoe gear. Pain is relieved with periodic professional debridement.  PCP is Bartholome Bill, MD. Last visit was: 01/20/2021.  She states she has started immunotherapy for ovarian cancer.  No Known Allergies  Objective: Physical Exam  General: Amy Richards is a pleasant 75 y.o. African American female, WD, WN in NAD. AAO x 3.   Vascular:  Capillary refill time to digits immediate b/l. Palpable pedal pulses b/l LE. Pedal hair sparse. Lower extremity skin temperature gradient within normal limits. No pain with calf compression b/l. No edema noted b/l lower extremities.  Dermatological:  Pedal skin with normal turgor, texture and tone b/l lower extremities. Toenails 1-5 b/l elongated, discolored, dystrophic, thickened, crumbly with subungual debris and tenderness to dorsal palpation. Hyperkeratotic lesion(s) L 2nd toe, L 4th toe, R hallux, R 5th toe, submet head 3 left foot, and submet head 3 right foot.  No erythema, no edema, no drainage, no fluctuance.  Musculoskeletal:  Muscle strength 5/5 to all LE muscle groups of right lower extremity. Muscle strength 4/5to all LE muscle groups of left lower extremity. Dropfoot left lower extremity. No pain crepitus or joint limitation noted with ROM b/l. Hallux valgus with bunion deformity noted b/l lower extremities. Hammertoe(s) noted to the 2-5 bilaterally.  Neurological:  Protective sensation intact with 10 gram monofilament right lower extremity. Protective sensation diminished with 10 gram monofilament left lower extremity.  Assessment and Plan:  1. Pain due to onychomycosis of toenails of both feet   2. Corns and callosities   3. Neuropathy of left foot       -Examined patient. -Patient to continue soft, supportive shoe gear daily. -Toenails 1-5 b/l were debrided in length and girth with sterile nail nippers and dremel without iatrogenic bleeding.  -Corn(s) L 2nd toe, L 4th toe, and R 5th toe and callus(es) submet head 3 left foot and submet head 3 right foot were pared utilizing sterile scalpel blade without incident. Total number debrided =5. -Patient to report any pedal injuries to medical professional immediately. -Patient/POA to call should there be question/concern in the interim.  Return in about 9 weeks (around 06/13/2021).  Marzetta Board, DPM

## 2021-06-13 ENCOUNTER — Ambulatory Visit: Payer: Medicare HMO | Admitting: Podiatry

## 2021-06-13 ENCOUNTER — Encounter: Payer: Self-pay | Admitting: Podiatry

## 2021-06-13 ENCOUNTER — Other Ambulatory Visit: Payer: Self-pay

## 2021-06-13 DIAGNOSIS — B351 Tinea unguium: Secondary | ICD-10-CM | POA: Diagnosis not present

## 2021-06-13 DIAGNOSIS — G5792 Unspecified mononeuropathy of left lower limb: Secondary | ICD-10-CM

## 2021-06-13 DIAGNOSIS — L84 Corns and callosities: Secondary | ICD-10-CM

## 2021-06-13 DIAGNOSIS — M79674 Pain in right toe(s): Secondary | ICD-10-CM

## 2021-06-13 DIAGNOSIS — M79675 Pain in left toe(s): Secondary | ICD-10-CM

## 2021-06-20 NOTE — Progress Notes (Signed)
  Subjective:  Patient ID: Amy Richards, female    DOB: 06-26-1946,  MRN: [id number]  Amy Richards presents to clinic today for at risk foot care with history of peripheral neuropathy and corn(s) b/l feet , callus(es) b/l feet and painful mycotic nails.  Pain interferes with ambulation. Aggravating factors include wearing enclosed shoe gear. Painful toenails interfere with ambulation. Aggravating factors include wearing enclosed shoe gear. Pain is relieved with periodic professional debridement. Painful corns and calluses are aggravated when weightbearing with and without shoegear. Pain is relieved with periodic professional debridement.  Patient states she is receiving immunotherapy for ovarian cancer and she is having favorable results as noted by decrease in her most recent CA-125 level.  PCP is Bartholome Bill, MD , and last visit was 05/26/2021.  No Known Allergies  Review of Systems: Negative except as noted in the HPI. Objective:   Constitutional Amy Richards is a pleasant 75 y.o. African American female, WD, WN in NAD. AAO x 3.   Vascular Capillary refill time to digits immediate b/l. Palpable DP pulse(s) b/l lower extremities Palpable PT pulse(s) b/l lower extremities Pedal hair sparse. Lower extremity skin temperature gradient within normal limits. No pain with calf compression b/l. No cyanosis or clubbing noted.  Neurologic Normal speech. Oriented to person, place, and time. Pt has subjective symptoms of neuropathy. Protective sensation intact with 10 gram monofilament right lower extremity. Protective sensation diminished with 10 gram monofilament left lower extremity. Dropfoot noted left lower extremity.  Dermatologic Skin warm and supple b/l lower extremities. No open wounds b/l lower extremities. No interdigital macerations b/l lower extremities. Toenails 1-5 b/l elongated, discolored, dystrophic, thickened, crumbly with subungual debris and tenderness to dorsal  palpation. Hyperkeratotic lesion(s) L 2nd toe, L 4th toe, R hallux, R 5th toe, submet head 3 left foot, and submet head 3 right foot.  No erythema, no edema, no drainage, no fluctuance.  Orthopedic: Normal muscle strength 5/5 to all lower extremity muscle groups bilaterally. Hallux valgus with bunion deformity noted b/l lower extremities. Hammertoe(s) noted to the 2-5 bilaterally. Patient ambulates independent of any assistive aids.   Radiographs: None Assessment:   1. Pain due to onychomycosis of toenails of both feet   2. Corns and callosities   3. Neuropathy of left foot    Plan:  Patient was evaluated and treated and all questions answered. Consent given for treatment as described below: -Examined patient. -Patient to continue soft, supportive shoe gear daily. -Toenails 1-5 b/l were debrided in length and girth with sterile nail nippers and dremel without iatrogenic bleeding.  -Corn(s) L 2nd toe, L 4th toe, and R 5th toe and callus(es) R hallux, submet head 3 left foot, and submet head 3 right foot were pared utilizing sterile scalpel blade without incident. Total number debrided =6. -Patient to report any pedal injuries to medical professional immediately. -Patient/POA to call should there be question/concern in the interim.  Return in about 9 weeks (around 08/15/2021).  Marzetta Board, DPM

## 2021-08-15 ENCOUNTER — Other Ambulatory Visit: Payer: Self-pay

## 2021-08-15 ENCOUNTER — Ambulatory Visit: Payer: Medicare HMO | Admitting: Podiatry

## 2021-08-15 ENCOUNTER — Encounter: Payer: Self-pay | Admitting: Podiatry

## 2021-08-15 DIAGNOSIS — M79675 Pain in left toe(s): Secondary | ICD-10-CM

## 2021-08-15 DIAGNOSIS — B351 Tinea unguium: Secondary | ICD-10-CM

## 2021-08-15 DIAGNOSIS — L84 Corns and callosities: Secondary | ICD-10-CM | POA: Diagnosis not present

## 2021-08-15 DIAGNOSIS — M79674 Pain in right toe(s): Secondary | ICD-10-CM

## 2021-08-15 DIAGNOSIS — G5792 Unspecified mononeuropathy of left lower limb: Secondary | ICD-10-CM

## 2021-08-19 NOTE — Progress Notes (Signed)
Subjective: Amy Richards is a 75 y.o. female patient seen today for follow up of at risk foot care. She has h/o peripheral neuropathy. She is seen for corns b/l, calluses b/l and painful thick toenails that are difficult to trim. Pain interferes with ambulation. Aggravating factors include wearing enclosed shoe gear. Pain is relieved with periodic professional debridement.  New problems reported today: None.  Patient has h/o ovarian cancer. She states her CA125 level is increasing indicating immunotherapy is no longer working. She is thinking about chemotherapy.  PCP is Bartholome Bill, MD. Last visit was: 05/26/2021.  No Known Allergies  Objective: Physical Exam  General: Patient is a pleasant 75 y.o. African American female WD, WN in NAD. AAO x 3.   Neurovascular Examination: CFT immediate b/l LE. Palpable DP/PT pulses b/l LE. Digital hair sparse b/l. Skin temperature gradient WNL b/l. No pain with calf compression b/l. No edema noted b/l. No cyanosis or clubbing noted b/l LE. Varicosities present b/l.  Pt has subjective symptoms of neuropathy. Protective sensation intact with 10 gram monofilament right lower extremity. Protective sensation diminished with 10 gram monofilament left lower extremity. Dropfoot noted left lower extremity.  Dermatological:  Pedal integument with normal turgor, texture and tone b/l LE. No open wounds b/l. No interdigital macerations b/l. Toenails 1-5 b/l elongated, thickened, discolored with subungual debris. +Tenderness with dorsal palpation of nailplates. Hyperkeratotic lesion(s) noted L hallux, L 2nd toe, L 5th toe, R 5th toe, and submet head 2 b/l.  Musculoskeletal:  Normal muscle strength 5/5 to all lower extremity muscle groups bilaterally. HAV with bunion deformity noted b/l LE. Hammertoe deformity noted 2-5 b/l.Marland Kitchen No pain, crepitus or joint limitation noted with ROM b/l LE.  Patient ambulates independently without assistive  aids.  Assessment: 1. Pain due to onychomycosis of toenails of both feet   2. Corns and callosities   3. Neuropathy of left foot    Plan: Patient was evaluated and treated and all questions answered. Consent given for treatment as described below: -Mycotic toenails 1-5 bilaterally were debrided in length and girth with sterile nail nippers and dremel without incident. -Corn(s) L hallux, L 2nd toe, L 5th toe, and R 5th toe and callus(es) submet head 2 b/l were pared utilizing sterile scalpel blade without incident. Total number debrided =6. -Patient/POA to call should there be question/concern in the interim.  Return in about 3 months (around 11/15/2021).  Marzetta Board, DPM

## 2021-09-10 ENCOUNTER — Ambulatory Visit: Payer: Medicare HMO | Admitting: Podiatry

## 2021-10-16 DIAGNOSIS — D702 Other drug-induced agranulocytosis: Secondary | ICD-10-CM | POA: Insufficient documentation

## 2021-10-17 ENCOUNTER — Ambulatory Visit: Payer: Medicare HMO | Admitting: Podiatry

## 2021-11-04 ENCOUNTER — Ambulatory Visit: Payer: Medicare HMO | Admitting: Podiatry

## 2021-11-04 ENCOUNTER — Encounter: Payer: Self-pay | Admitting: Podiatry

## 2021-11-04 ENCOUNTER — Other Ambulatory Visit: Payer: Self-pay

## 2021-11-04 DIAGNOSIS — B351 Tinea unguium: Secondary | ICD-10-CM

## 2021-11-04 DIAGNOSIS — M79675 Pain in left toe(s): Secondary | ICD-10-CM | POA: Diagnosis not present

## 2021-11-04 DIAGNOSIS — G5792 Unspecified mononeuropathy of left lower limb: Secondary | ICD-10-CM

## 2021-11-04 DIAGNOSIS — M79674 Pain in right toe(s): Secondary | ICD-10-CM | POA: Diagnosis not present

## 2021-11-04 DIAGNOSIS — L84 Corns and callosities: Secondary | ICD-10-CM

## 2021-11-04 NOTE — Progress Notes (Signed)
This patient returns to the office for evaluation and treatment of long thick painful nails .  This patient is unable to trim her own nails since the patient cannot reach her feet.  Patient says the nails are painful walking and wearing her shoes.  She has painful corn between her 1/2 digits left foot.  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   She returns for preventive foot care services.  General Appearance  Alert, conversant and in no acute stress.  Vascular  Dorsalis pedis and posterior tibial  pulses are palpable  bilaterally.  Capillary return is within normal limits  bilaterally. Temperature is within normal limits  bilaterally.  Neurologic  Senn-Weinstein monofilament wire test within normal limits  right foot. LOPS diminished left foot.Muscle power within normal limits bilaterally.  Nails Thick disfigured discolored nails with subungual debris  from hallux to fifth toes bilaterally. No evidence of bacterial infection or drainage bilaterally.  Orthopedic  No limitations of motion  feet .  No crepitus or effusions noted.  No bony pathology .  HAV  B/L.  Hammer toes 2-5  B/L  Skin  normotropic skin with no porokeratosis noted bilaterally.  No signs of infections or ulcers noted.   [place] durum fifth toe left foot.  Corn hallux/second toe left foot.    Onychomycosis  Pain in toes right foot  Pain in toes left foot  Corn 1/2 left foot.  Debridement  of nails  1-5  B/L with a nail nipper.  Nails were then filed using a dremel tool with no incidents.  Debride corn with # 15 blade.  Padding dispensed.    RTC 3 months  Gardiner Barefoot DPM    Gardiner Barefoot DPM

## 2021-11-18 ENCOUNTER — Ambulatory Visit: Payer: Medicare HMO | Admitting: Podiatry

## 2021-11-19 ENCOUNTER — Ambulatory Visit: Payer: Medicare HMO | Admitting: Podiatry

## 2021-12-19 ENCOUNTER — Ambulatory Visit: Payer: Medicare HMO | Admitting: Podiatry

## 2022-02-03 ENCOUNTER — Ambulatory Visit: Payer: Medicare HMO | Admitting: Podiatry

## 2022-02-11 ENCOUNTER — Ambulatory Visit: Payer: Medicare HMO | Admitting: Podiatry

## 2022-02-11 ENCOUNTER — Encounter: Payer: Self-pay | Admitting: Podiatry

## 2022-02-11 DIAGNOSIS — L84 Corns and callosities: Secondary | ICD-10-CM

## 2022-02-11 DIAGNOSIS — G5792 Unspecified mononeuropathy of left lower limb: Secondary | ICD-10-CM

## 2022-02-11 DIAGNOSIS — M79675 Pain in left toe(s): Secondary | ICD-10-CM | POA: Diagnosis not present

## 2022-02-11 DIAGNOSIS — M79674 Pain in right toe(s): Secondary | ICD-10-CM | POA: Diagnosis not present

## 2022-02-11 DIAGNOSIS — B351 Tinea unguium: Secondary | ICD-10-CM

## 2022-02-17 NOTE — Progress Notes (Unsigned)
  Subjective:  Patient ID: Amy Richards, female    DOB: 05/08/1946,  MRN: [id number]  Koraima V Maillet presents to clinic today for {jgcomplaint:23593}  Patient states blood glucose was *** mg/dl {Time; today/yesterday/ 2 days ago:19188} ***.    Last known HgA1c was ***%.  Patient did not check blood glucose today.  Patient does not monitor blood glucose daily.  Patient is not required to monitor blood glucose daily.  New problem(s): None.   PCP is Bartholome Bill, MD , and last visit was {Time; dates multiple:304500300}.  No Known Allergies  Review of Systems: Negative except as noted in the HPI.  OGeneral: Patient is a pleasant 76 y.o. African American female WD, WN in NAD. AAO x 3.   Neurovascular Examination: CFT immediate b/l LE. Palpable DP/PT pulses b/l LE. Digital hair sparse b/l. Skin temperature gradient WNL b/l. No pain with calf compression b/l. No edema noted b/l. No cyanosis or clubbing noted b/l LE. Varicosities present b/l.  Pt has subjective symptoms of neuropathy. Protective sensation intact with 10 gram monofilament right lower extremity. Protective sensation diminished with 10 gram monofilament left lower extremity. Dropfoot noted left lower extremity.  Dermatological:  Pedal integument with normal turgor, texture and tone b/l LE. No open wounds b/l. No interdigital macerations b/l. Toenails 1-5 b/l elongated, thickened, discolored with subungual debris. +Tenderness with dorsal palpation of nailplates. Hyperkeratotic lesion(s) noted L hallux, L 2nd toe, L 5th toe, R 5th toe, and submet head 2 b/l.  Musculoskeletal:  Normal muscle strength 5/5 to all lower extremity muscle groups bilaterally. HAV with bunion deformity noted b/l LE. Hammertoe deformity noted 2-5 b/l.Marland Kitchen No pain, crepitus or joint limitation noted with ROM b/l LE.  Patient ambulates independently without assistive aids. Assessment/Plan: No diagnosis found.   {Jgplan:23602::"-Patient/POA  to call should there be question/concern in the interim."}   Return in about 9 weeks (around 04/15/2022).  Marzetta Board, DPM

## 2022-04-15 ENCOUNTER — Ambulatory Visit: Payer: Medicare HMO | Admitting: Podiatry

## 2022-04-15 ENCOUNTER — Encounter: Payer: Self-pay | Admitting: Podiatry

## 2022-04-15 DIAGNOSIS — L84 Corns and callosities: Secondary | ICD-10-CM | POA: Diagnosis not present

## 2022-04-15 DIAGNOSIS — M79674 Pain in right toe(s): Secondary | ICD-10-CM | POA: Diagnosis not present

## 2022-04-15 DIAGNOSIS — G5792 Unspecified mononeuropathy of left lower limb: Secondary | ICD-10-CM

## 2022-04-15 DIAGNOSIS — M79675 Pain in left toe(s): Secondary | ICD-10-CM

## 2022-04-15 DIAGNOSIS — Q828 Other specified congenital malformations of skin: Secondary | ICD-10-CM | POA: Diagnosis not present

## 2022-04-15 DIAGNOSIS — B351 Tinea unguium: Secondary | ICD-10-CM

## 2022-04-20 NOTE — Progress Notes (Signed)
  Subjective:  Patient ID: Amy Richards, female    DOB: 10/04/1945,  MRN: [id number]  Amy Richards presents to clinic today for at risk foot care with history of peripheral neuropathy and corn(s) b/l lower extremities, callus(es) b/l lower extremities and painful mycotic nails.  Pain interferes with ambulation. Aggravating factors include wearing enclosed shoe gear. Painful toenails interfere with ambulation. Aggravating factors include wearing enclosed shoe gear. Pain is relieved with periodic professional debridement. Painful corns and calluses are aggravated when weightbearing with and without shoegear. Pain is relieved with periodic professional debridement.  New problem(s): None.   PCP is Bartholome Bill, MD , and last visit was  August, 2022.  No Known Allergies  Review of Systems: Negative except as noted in the HPI.  She states her number in regards to her ovarian cancer is good.  Objective: No changes noted in today's physical examination.  General: Patient is a pleasant 76 y.o. African American female WD, WN in NAD. AAO x 3.   Neurovascular Examination: CFT immediate b/l LE. Palpable DP/PT pulses b/l LE. Digital hair sparse b/l. Skin temperature gradient WNL b/l. No pain with calf compression b/l. No edema noted b/l. No cyanosis or clubbing noted b/l LE. Varicosities present b/l.  Pt has subjective symptoms of neuropathy. Protective sensation intact with 10 gram monofilament right lower extremity. Protective sensation diminished with 10 gram monofilament left lower extremity. Dropfoot noted left lower extremity.  Dermatological:  Pedal integument with normal turgor, texture and tone b/l LE. No open wounds b/l. No interdigital macerations b/l. Toenails 1-5 b/l elongated, thickened, discolored with subungual debris. +Tenderness with dorsal palpation of nailplates. Hyperkeratotic lesion(s) noted L hallux, L 2nd toe, medial IPJ of bilateral great toes. Porokeratotic lesion  submet head 2 left foot.  Musculoskeletal:  Normal muscle strength 5/5 to all lower extremity muscle groups bilaterally. HAV with bunion deformity noted b/l LE. Hammertoe deformity noted 2-5 b/l.Marland Kitchen No pain, crepitus or joint limitation noted with ROM b/l LE.  Patient ambulates independently without assistive aids.  Assessment/Plan: 1. Pain due to onychomycosis of toenails of both feet   2. Corns and callosities   3. Porokeratosis   4. Neuropathy of left foot     -Examined patient. -Patient to continue soft, supportive shoe gear daily. -Mycotic toenails bilateral great toes, 3-5 bilaterally, and R 3rd toe were debrided in length and girth with sterile nail nippers and dremel without iatrogenic bleeding. Patient declines debridement of left 2nd toenail. -Corn(s) medial IPJ of L 2nd toe and lateral IPJ of left great toe pared utilizing sterile scalpel blade without complication or incident. Total number debrided=2. -Callus(es) medial IPJ of bilateral great toes pared utilizing sterile scalpel blade without complication or incident. Total number debrided =2. -Porokeratotic lesion(s) submet head 2 left foot pared and enucleated with sterile scalpel blade without incident. Total number of lesions debrided=1. -Patient/POA to call should there be question/concern in the interim.   Return in about 9 weeks (around 06/17/2022).  Marzetta Board, DPM

## 2022-05-28 ENCOUNTER — Ambulatory Visit (INDEPENDENT_AMBULATORY_CARE_PROVIDER_SITE_OTHER): Payer: Medicare HMO

## 2022-05-28 ENCOUNTER — Ambulatory Visit: Payer: Medicare HMO | Admitting: Podiatry

## 2022-05-28 DIAGNOSIS — L84 Corns and callosities: Secondary | ICD-10-CM

## 2022-05-28 DIAGNOSIS — M2042 Other hammer toe(s) (acquired), left foot: Secondary | ICD-10-CM

## 2022-05-28 DIAGNOSIS — M21612 Bunion of left foot: Secondary | ICD-10-CM

## 2022-05-28 DIAGNOSIS — M21611 Bunion of right foot: Secondary | ICD-10-CM | POA: Diagnosis not present

## 2022-05-31 NOTE — Progress Notes (Signed)
Subjective:  Patient ID: Amy Richards, female    DOB: Jul 26, 1946,  MRN: [id number]  Chief Complaint  Patient presents with   Bunions    bil bunion pain/left is more severe. Wants to discuss lapidus procedure.   Nail Problem    Patient sees Dr. Elisha Ponder for her routine foot care    76 y.o. female presents with the above complaint. Patient presenting for bunions, both feet, left worse than right. Wants to discuss if surgical intervention is warranted or feasible at this time. She states that overall the bunions are not bothering her/ limiting her much at this point. She feels she can still manage with shoe gear modification and does not have a lot pain associated with the bunions at this time. Sees Dr. Elisha Ponder for nail care.   Review of Systems: Negative except as noted in the HPI. Denies N/V/F/Ch.  Past Medical History:  Diagnosis Date   Dental crowns present    right upper   Foot drop, left    wears brace at night   Hypercholesteremia    Left leg numbness    s/p accident 2010, numb from knee to foot   MVP (mitral valve prolapse)    palpatations   PSVT (paroxysmal supraventricular tachycardia) (HCC)     Current Outpatient Medications:    aspirin 81 MG EC tablet, Take by mouth., Disp: , Rfl:    atenolol (TENORMIN) 25 MG tablet, Take by mouth., Disp: , Rfl:    Cholecalciferol 125 MCG (5000 UT) capsule, Take by mouth., Disp: , Rfl:    Cobalamin Combinations (VITAMIN B12-FOLIC ACID) 338-250 MCG TABS, Vitamin B12, Disp: , Rfl:    Milk Thistle 140 MG CAPS, , Disp: , Rfl:    mineral oil liquid, Take by mouth., Disp: , Rfl:    Omega 3-6-9 CAPS, Take by mouth daily., Disp: , Rfl:    pravastatin (PRAVACHOL) 20 MG tablet, Take by mouth., Disp: , Rfl:    Probiotic Product (MISC INTESTINAL FLORA REGULAT) CAPS, Take 1 capsule by mouth daily., Disp: , Rfl:    UNABLE TO FIND, Mineral Rich - liquid juice, Disp: , Rfl:    vitamin B-12 (CYANOCOBALAMIN) 500 MCG tablet, Take by mouth., Disp: ,  Rfl:    Zinc Methionate 50 MG CAPS, Take by mouth., Disp: , Rfl:   Social History   Tobacco Use  Smoking Status Never  Smokeless Tobacco Never    No Known Allergies Objective:  There were no vitals filed for this visit. There is no height or weight on file to calculate BMI. Constitutional Well developed. Well nourished.  Vascular Dorsalis pedis pulses palpable bilaterally. Posterior tibial pulses palpable bilaterally. Capillary refill normal to all digits.  No cyanosis or clubbing noted. Pedal hair growth normal.  Neurologic Normal speech. Oriented to person, place, and time. Epicritic sensation to light touch grossly present bilaterally.  Dermatologic Nails well groomed and normal in appearance. No open wounds. Callus present distal aspect of left 2nd toe related to hammertoe deformity  Orthopedic: Normal joint ROM without pain or crepitus bilaterally. Hallux abductovalgus deformity present Left 1st MPJ diminished range of motion. Left 1st TMT with gross hypermobility. Right 1st MPJ diminished range of motion  Right 1st TMT with gross hypermobility. Lesser digital contractures present bilaterally.   Radiographs: Taken and reviewed. Hallux abductovalgus deformity present. Metatarsal parabola normal. 1st/2nd IMA: 19 degrees bilaterally. Flexion contracture of the 2nd toe left foot.  Assessment:   1. Bilateral bunions   2. 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 Callus of toe    Plan:  Patient was evaluated and treated and all questions answered.  Hallux abductovalgus deformity,  -XR as above. -Patient doing alright with conservative therapy. Did discuss what bunion correction surgery would involve in detail.Marland Kitchen Post-op course explained at length. -Patient wishes to defer surgical intervention at this time as the bunions are not bothering her enough to want surgical correction. Discussed they may worsen over time at which point could consider intervention and would be  happy to discuss further with her if she elects for surgical correction.  Can follow up with Dr. Elisha Ponder for ongoing nail care  No follow-ups on file.

## 2022-06-11 DIAGNOSIS — K5730 Diverticulosis of large intestine without perforation or abscess without bleeding: Secondary | ICD-10-CM | POA: Insufficient documentation

## 2022-06-11 DIAGNOSIS — K648 Other hemorrhoids: Secondary | ICD-10-CM | POA: Insufficient documentation

## 2022-06-14 ENCOUNTER — Other Ambulatory Visit: Payer: Self-pay

## 2022-06-14 ENCOUNTER — Encounter (HOSPITAL_COMMUNITY): Payer: Self-pay | Admitting: Emergency Medicine

## 2022-06-14 ENCOUNTER — Emergency Department (HOSPITAL_COMMUNITY)
Admission: EM | Admit: 2022-06-14 | Discharge: 2022-06-14 | Disposition: A | Discharge status: Home / Self Care | Payer: Medicare HMO | Attending: Emergency Medicine | Admitting: Emergency Medicine

## 2022-06-14 DIAGNOSIS — R112 Nausea with vomiting, unspecified: Secondary | ICD-10-CM | POA: Diagnosis not present

## 2022-06-14 DIAGNOSIS — R1030 Lower abdominal pain, unspecified: Secondary | ICD-10-CM | POA: Insufficient documentation

## 2022-06-14 DIAGNOSIS — Z8543 Personal history of malignant neoplasm of ovary: Secondary | ICD-10-CM | POA: Diagnosis not present

## 2022-06-14 DIAGNOSIS — Z7982 Long term (current) use of aspirin: Secondary | ICD-10-CM | POA: Diagnosis not present

## 2022-06-14 DIAGNOSIS — R197 Diarrhea, unspecified: Secondary | ICD-10-CM | POA: Insufficient documentation

## 2022-06-14 DIAGNOSIS — R1110 Vomiting, unspecified: Secondary | ICD-10-CM | POA: Diagnosis present

## 2022-06-14 LAB — CBC
HCT: 41.9 % (ref 36.0–46.0)
Hemoglobin: 13.9 g/dL (ref 12.0–15.0)
MCH: 31.3 pg (ref 26.0–34.0)
MCHC: 33.2 g/dL (ref 30.0–36.0)
MCV: 94.4 fL (ref 80.0–100.0)
Platelets: 232 10*3/uL (ref 150–400)
RBC: 4.44 MIL/uL (ref 3.87–5.11)
RDW: 12.9 % (ref 11.5–15.5)
WBC: 7.3 10*3/uL (ref 4.0–10.5)
nRBC: 0 % (ref 0.0–0.2)

## 2022-06-14 LAB — COMPREHENSIVE METABOLIC PANEL
ALT: 20 U/L (ref 0–44)
AST: 24 U/L (ref 15–41)
Albumin: 4.2 g/dL (ref 3.5–5.0)
Alkaline Phosphatase: 41 U/L (ref 38–126)
Anion gap: 11 (ref 5–15)
BUN: 11 mg/dL (ref 8–23)
CO2: 23 mmol/L (ref 22–32)
Calcium: 9.7 mg/dL (ref 8.9–10.3)
Chloride: 105 mmol/L (ref 98–111)
Creatinine, Ser: 0.8 mg/dL (ref 0.44–1.00)
GFR, Estimated: >60 mL/min (ref >60)
Glucose, Bld: 142 mg/dL — ABNORMAL HIGH (ref 70–99)
Potassium: 4.3 mmol/L (ref 3.5–5.1)
Sodium: 139 mmol/L (ref 135–145)
Total Bilirubin: 1 mg/dL (ref 0.3–1.2)
Total Protein: 7.6 g/dL (ref 6.5–8.1)

## 2022-06-14 LAB — URINALYSIS, ROUTINE W REFLEX MICROSCOPIC
Bilirubin Urine: NEGATIVE
Glucose, UA: NEGATIVE mg/dL
Ketones, ur: NEGATIVE mg/dL
Leukocytes,Ua: NEGATIVE
Nitrite: NEGATIVE
Protein, ur: NEGATIVE mg/dL
Specific Gravity, Urine: >1.03 — ABNORMAL HIGH (ref 1.005–1.030)
pH: 6 (ref 5.0–8.0)

## 2022-06-14 LAB — URINALYSIS, MICROSCOPIC (REFLEX)

## 2022-06-14 LAB — LIPASE, BLOOD: Lipase: 19 U/L (ref 11–51)

## 2022-06-14 MED ORDER — PROMETHAZINE HCL 25 MG PO TABS: 25.0000 mg | ORAL_TABLET | Freq: Four times a day (QID) | ORAL | 0 refills | Status: DC | PRN

## 2022-06-14 MED ORDER — SODIUM CHLORIDE 0.9 % IV SOLN: 12.5000 mg | Freq: Four times a day (QID) | INTRAVENOUS | Status: DC | PRN

## 2022-06-14 MED ORDER — ONDANSETRON HCL 4 MG/2ML IJ SOLN
4.0000 mg | Freq: Once | INTRAMUSCULAR | Status: AC
  Administered 2022-06-14: 4 mg via INTRAVENOUS
  Filled 2022-06-14: qty 2

## 2022-06-14 MED ORDER — PROMETHAZINE HCL 25 MG PO TABS
25.0000 mg | ORAL_TABLET | Freq: Once | ORAL | Status: AC
  Administered 2022-06-14: 25 mg via ORAL
  Filled 2022-06-14: qty 1

## 2022-06-14 MED ORDER — LACTATED RINGERS IV BOLUS
1000.0000 mL | Freq: Once | INTRAVENOUS | Status: AC
  Administered 2022-06-14: 1000 mL via INTRAVENOUS

## 2022-06-14 MED ORDER — ONDANSETRON 4 MG PO TBDP
4.0000 mg | ORAL_TABLET | Freq: Once | ORAL | Status: AC | PRN
  Administered 2022-06-14: 4 mg via ORAL
  Filled 2022-06-14: qty 1

## 2022-06-14 NOTE — ED Notes (Signed)
Attempted po challenge. Pt continues with nausea, belching and some emesis.

## 2022-06-14 NOTE — ED Notes (Signed)
Per previous nurse brenda, patient passed ambulation trial but failed PO challenge thus far and per her the provider was made aware.

## 2022-06-14 NOTE — ED Triage Notes (Signed)
Patient here with complaint of emesis, diarrhea, and abdominal pain after eating at a [employer] yesterday. Patient is alert, oriented, and in no apparent distress at this time.

## 2022-06-14 NOTE — Discharge Instructions (Addendum)
You were seen in the ER for nausea, vomiting and diarrhea.  The work-up in the emergency room is overall reassuring.  You are feeling a lot better.  Our plan was to proceed with CT scan to ensure there is no blockage, however because you have started feeling well -you are preferring wait and watch approach.  We recommend that your diet be very simple today.  Ideally you should have liquid and soft diet such as chicken soup, yogurt.  If you tolerate them then you can advance to simple foods such as fruits, vegetables, rice and pasta.  If you continue to do well then he can advance to normal food.  Return to the emergency room if you start having severe nausea and vomiting, severe abdominal pain.

## 2022-06-14 NOTE — ED Provider Notes (Signed)
[facility] EMERGENCY DEPARTMENT Provider Note   CSN: [id number] Arrival date & time: 06/14/22  0941     History  Chief Complaint  Patient presents with   Emesis    Amy Richards is a 76 y.o. female.  HPI     Patient is a 76 year old female presenting with a chief complaint of emesis. Patient reports she developed her symptoms after eating at a [employer] at around 2:30pm. She states she got home around 5pm and she started having diarrhea. She had one episode of diarrhea. She started vomiting and states some of the vomit had a green color to it. Patient reports she had between 5-10 episodes of emesis from yesterday to today. She also reports abdominal pain worse in the lower quadrants and bloating that she noticed yesterday. Patient has a past medical history of ovarian cancer and had a hysterectomy in 2022. She denies fever, cough, and dizziness, chest pain.  Home Medications Prior to Admission medications   Medication Sig Start Date End Date Taking? Authorizing Provider  promethazine (PHENERGAN) 25 MG tablet Take 1 tablet (25 mg total) by mouth every 6 (six) hours as needed for nausea or vomiting. 06/14/22  Yes Varney Biles, MD  aspirin 81 MG EC tablet Take by mouth.    [provider]  atenolol (TENORMIN) 25 MG tablet Take by mouth. 09/30/20   [provider]  Cholecalciferol 125 MCG (5000 UT) capsule Take by mouth.    [provider]  Cobalamin Combinations (VITAMIN B12-FOLIC ACID) 671-245 MCG TABS Vitamin B12    [provider]  Milk Thistle 140 MG CAPS  12/03/21   [provider]  mineral oil liquid Take by mouth.    [provider]  Omega 3-6-9 CAPS Take by mouth daily.    [provider]  pravastatin (PRAVACHOL) 20 MG tablet Take by mouth. 10/29/16   [provider]  Probiotic Product (MISC INTESTINAL FLORA REGULAT) CAPS Take 1 capsule by mouth daily.    [provider]  UNABLE TO FIND Mineral Rich - liquid juice    [provider]  vitamin B-12 (CYANOCOBALAMIN) 500 MCG tablet Take by mouth.    [provider]  Zinc Methionate 50 MG CAPS Take by mouth.    [provider]      Allergies    Patient has no known allergies.    Review of Systems   Review of Systems  All other systems reviewed and are negative.   Physical Exam Updated Vital Signs BP (!) 172/85   Pulse 75   Temp 98.3 F (36.8 C) (Oral)   Resp 18   SpO2 96%  Physical Exam Vitals and nursing note reviewed.  Constitutional:      Appearance: She is well-developed.  HENT:     Head: Atraumatic.  Cardiovascular:     Rate and Rhythm: Normal rate.  Pulmonary:     Effort: Pulmonary effort is normal.  Abdominal:     Palpations: Abdomen is soft.     Tenderness: There is no guarding or rebound.     Comments: Mild tenderness to the lower quadrant  Musculoskeletal:     Cervical back: Normal range of motion and neck supple.  Skin:    General: Skin is warm and dry.  Neurological:     Mental Status: She is alert and oriented to person, place, and time.     ED Results / Procedures / Treatments   Labs (all  labs ordered are listed, but only abnormal results are displayed) Labs Reviewed  COMPREHENSIVE METABOLIC PANEL - Abnormal; Notable for the following components:      Result Value   Glucose, Bld 142 (*)    All other components within normal limits  URINALYSIS, ROUTINE W REFLEX MICROSCOPIC - Abnormal; Notable for the following components:   Specific Gravity, Urine >1.030 (*)    Hgb urine dipstick SMALL (*)    All other components within normal limits  URINALYSIS, MICROSCOPIC (REFLEX) - Abnormal; Notable for the following components:   Bacteria, UA RARE (*)    All other components within normal limits  LIPASE, BLOOD  CBC    EKG None  Radiology No results found.  Procedures Procedures    Medications Ordered in ED Medications   ondansetron (ZOFRAN-ODT) disintegrating tablet 4 mg (4 mg Oral Given 06/14/22 1020)  lactated ringers bolus 1,000 mL (0 mLs Intravenous Stopped 06/14/22 1431)  ondansetron (ZOFRAN) injection 4 mg (4 mg Intravenous Given 06/14/22 1321)  promethazine (PHENERGAN) tablet 25 mg (25 mg Oral Given 06/14/22 1549)    ED Course/ Medical Decision Making/ A&P Clinical Course as of 06/14/22 1606  Sun Jun 14, 2022  1534 Patient's labs are reassuring besides elevated specific gravity.  She has already received IV fluid.  We had initiated p.o. challenge. Patient states that her nausea is better but not completely resolved.  Nursing staff informed me that patient actually threw up juice.  I went to reassess the patient with the intent of informing her that we will proceed with CT scan given that she failed oral challenge.  However patient states that she had 4 episodes of burping, and now feels a lot better.  She does not want any further testing or IV medications.  I discussed with her that one of my concerns was that she might be having small bowel obstruction, partial and CT will be able to rule it out.  However, patient states that she is feeling a lot better and she would prefer waiting.  Patient comfortable, going home right now and returning if her symptoms get worse.  Strict ER return precautions have been discussed.  CT scan has been canceled.  We will give her oral promethazine.  Patient and her family member are comfortable with the plan.  They will return to the emergency room if they start having severe pain, constant nausea with vomiting.  Advised that diet needs to be clear liquid today, soft diet if they are doing better and then advance to more normal food by day 3 or 4.   [AN]    Clinical Course User Index [AN] Varney Biles, MD                           Medical Decision Making Amount and/or Complexity of Data Reviewed Labs: ordered.  Risk Prescription drug management.   This  patient presents to the ED with chief complaint(s) of nausea, vomiting, diarrhea with pertinent past medical history of ovarian cancer status post complete hysterectomy, patient is is currently in remission.she indicates that the symptoms started after she had shrimp at a [employer].   She feels bloated.    The complaint involves an extensive differential diagnosis and also carries with it a high risk of complications and morbidity.    The differential diagnosis includes : Ileus, gastroenteritis, small bowel obstruction.  Patient has reassuring abdominal exam.  However, she is indicating that she had bilious emesis  and after having diarrhea today, she is not having diarrhea and she is unsure if she is passing latus.  She is feeling bloated.  Given the cancer history, surgical history, ileus, partial small bowel obstruction, early small obstruction are all in the differential.  The initial plan is to get basic blood work-up, start p.o. challenge.  If patient fails p.o. challenge, we will proceed with CT abdomen and pelvis.  Patient comfortable with this plan.   Independent labs interpretation:  The following labs were independently interpreted: CBC, metabolic profile is reassuring, IV fluid has been ordered   Treatment and Reassessment: See above  Final Clinical Impression(s) / ED Diagnoses Final diagnoses:  Nausea vomiting and diarrhea    Rx / DC Orders ED Discharge Orders          Ordered    promethazine (PHENERGAN) 25 MG tablet  Every 6 hours PRN        06/14/22 [place], Addiel Mccardle, MD 06/14/22 1606

## 2022-07-06 ENCOUNTER — Encounter: Payer: Self-pay | Admitting: Podiatry

## 2022-07-06 ENCOUNTER — Ambulatory Visit: Payer: Medicare HMO | Admitting: Podiatry

## 2022-07-06 DIAGNOSIS — G5792 Unspecified mononeuropathy of left lower limb: Secondary | ICD-10-CM | POA: Diagnosis not present

## 2022-07-06 DIAGNOSIS — M2012 Hallux valgus (acquired), left foot: Secondary | ICD-10-CM | POA: Diagnosis not present

## 2022-07-06 DIAGNOSIS — M2042 Other hammer toe(s) (acquired), left foot: Secondary | ICD-10-CM

## 2022-07-06 DIAGNOSIS — M2011 Hallux valgus (acquired), right foot: Secondary | ICD-10-CM | POA: Diagnosis not present

## 2022-07-06 DIAGNOSIS — L84 Corns and callosities: Secondary | ICD-10-CM | POA: Diagnosis not present

## 2022-07-06 DIAGNOSIS — M79672 Pain in left foot: Secondary | ICD-10-CM | POA: Diagnosis not present

## 2022-07-06 DIAGNOSIS — Q828 Other specified congenital malformations of skin: Secondary | ICD-10-CM | POA: Diagnosis not present

## 2022-07-06 DIAGNOSIS — M2041 Other hammer toe(s) (acquired), right foot: Secondary | ICD-10-CM | POA: Diagnosis not present

## 2022-07-12 NOTE — Progress Notes (Signed)
  Subjective:  Patient ID: Amy Richards, female    DOB: [birth year],  MRN: [id number]  Amy Richards presents to clinic today for:  Chief Complaint  Patient presents with   Nail Problem    Routine foot care PCP-Tammy Boyd PCP VST-05/2022   Patient states she saw Dr. Loel Lofty for evaluation of bilateral bunion deformity and painful left 2nd hammertoe. She is strongly considering hammertoe correction of the left 2nd toe and states the bunions are not bothering her. She does wear leather boots and dress shoe gear.  PCP is Bartholome Bill, MD , and last visit was  June 08, 2022.  No Known Allergies  Review of Systems: Negative except as noted in the HPI.  Objective: No changes noted in today's physical examination.  Amy Richards is a pleasant 77 y.o. female WD, WN in NAD. AAO x 3.  Neurovascular Examination: CFT immediate b/l LE. Palpable DP/PT pulses b/l LE. Digital hair sparse b/l. Skin temperature gradient WNL b/l. No pain with calf compression b/l. No edema noted b/l. No cyanosis or clubbing noted b/l LE. Varicosities present b/l.  Pt has subjective symptoms of neuropathy. Protective sensation intact with 10 gram monofilament right lower extremity. Protective sensation diminished with 10 gram monofilament left lower extremity. Dropfoot noted left lower extremity.  Dermatological:  Pedal integument with normal turgor, texture and tone b/l LE. No open wounds b/l. No interdigital macerations b/l.   Toenails 1-5 b/l elongated, thickened, discolored with subungual debris. +Tenderness with dorsal palpation of nailplates.   Hyperkeratotic lesion(s) noted L hallux, L 2nd toe, medial IPJ of bilateral great toes. Porokeratotic lesion submet head 2 left foot.  Musculoskeletal:  Normal muscle strength 5/5 to all lower extremity muscle groups bilaterally. HAV with bunion deformity noted b/l LE. Hammertoe deformity noted 2-5 b/l.Marland Kitchen No pain, crepitus or joint limitation  noted with ROM b/l LE.  Patient ambulates independently without assistive aids.  Assessment/Plan: 1. Porokeratosis   2. Corns   3. Hallux valgus, acquired, bilateral   4. Acquired hammertoes of both feet   5. Pain in left foot   6. Neuropathy of left foot     No orders of the defined types were placed in this encounter.   -Consent given for treatment as described below: -Examined patient. -Patient seen by Dr.Standiford for painful left 2nd digit hammertoe and bilateral bunion deformity. She is considering repair of left 2nd hammertoe. I explained to her the cause of her hammertoe is the left bunion. If she has hammertoe corrected, bunion deformity will continue to dislocate laterally and may cause recurrence of her left 2nd digit hammertoe in several years. She related understanding. -Corn(s) lateral IPJ of left great toe pared utilizing sharp debridement with sterile blade without complication or incident. Total number debrided=1. -Porokeratotic lesion(s) medial PIPJ of L 2nd toe and dorsal PIPJ of L 4th toe pared and enucleated with sterile currette without incident. Total number of lesions debrided=2. -Patient/POA to call should there be question/concern in the interim.   Return in about 9 weeks (around 09/07/2022).  Marzetta Board, DPM

## 2022-07-23 ENCOUNTER — Ambulatory Visit: Payer: Medicare HMO | Admitting: Podiatry

## 2022-07-23 DIAGNOSIS — L84 Corns and callosities: Secondary | ICD-10-CM | POA: Diagnosis not present

## 2022-07-23 DIAGNOSIS — M2041 Other hammer toe(s) (acquired), right foot: Secondary | ICD-10-CM | POA: Diagnosis not present

## 2022-07-23 DIAGNOSIS — M79672 Pain in left foot: Secondary | ICD-10-CM

## 2022-07-23 DIAGNOSIS — M2012 Hallux valgus (acquired), left foot: Secondary | ICD-10-CM

## 2022-07-23 DIAGNOSIS — M2011 Hallux valgus (acquired), right foot: Secondary | ICD-10-CM

## 2022-07-23 DIAGNOSIS — M2042 Other hammer toe(s) (acquired), left foot: Secondary | ICD-10-CM

## 2022-07-23 NOTE — Progress Notes (Signed)
Subjective:  Patient ID: Amy Richards, female    DOB: 1946-05-27,  MRN: [id number]  Chief Complaint  Patient presents with   Bunions    bil bunion pain/left is more severe   Callouses    Corn to the top of 2nd toe on left foot.     76 y.o. female presents with the above complaint. Patient presenting for discussion of surgical intervention of the left foot bunion and hammertoe deformity of the second toe.  This was previously discussed and she did not want to proceed with it at that time but she is now coming back thinking she may want to proceed with surgical intervention.  She is interested in further discussing how we would address the primary complaint of pain at the medial aspect of the left second toe proximal phalangeal joint which is related to a callus at that area related to hallux valgus and hammertoe deformity.  Review of Systems: Negative except as noted in the HPI. Denies N/V/F/Ch.  Past Medical History:  Diagnosis Date   Dental crowns present    right upper   Foot drop, left    wears brace at night   Hypercholesteremia    Left leg numbness    s/p accident 2010, numb from knee to foot   MVP (mitral valve prolapse)    palpatations   PSVT (paroxysmal supraventricular tachycardia)     Current Outpatient Medications:    aspirin 81 MG EC tablet, Take by mouth., Disp: , Rfl:    atenolol (TENORMIN) 25 MG tablet, Take by mouth., Disp: , Rfl:    Cholecalciferol 125 MCG (5000 UT) capsule, Take by mouth., Disp: , Rfl:    Cobalamin Combinations (VITAMIN B12-FOLIC ACID) 031-594 MCG TABS, Vitamin B12, Disp: , Rfl:    Milk Thistle 140 MG CAPS, , Disp: , Rfl:    mineral oil liquid, Take by mouth., Disp: , Rfl:    Omega 3-6-9 CAPS, Take by mouth daily., Disp: , Rfl:    Probiotic Product (MISC INTESTINAL FLORA REGULAT) CAPS, Take 1 capsule by mouth daily., Disp: , Rfl:    UNABLE TO FIND, Mineral Rich - liquid juice, Disp: , Rfl:    vitamin B-12 (CYANOCOBALAMIN) 500 MCG tablet,  Take by mouth., Disp: , Rfl:    Zinc Methionate 50 MG CAPS, Take by mouth., Disp: , Rfl:   Social History   Tobacco Use  Smoking Status Never  Smokeless Tobacco Never    No Known Allergies Objective:  There were no vitals filed for this visit. There is no height or weight on file to calculate BMI. Constitutional Well developed. Well nourished.  Vascular Dorsalis pedis pulses palpable bilaterally. Posterior tibial pulses palpable bilaterally. Capillary refill normal to all digits.  No cyanosis or clubbing noted. Pedal hair growth normal.  Neurologic Normal speech. Oriented to person, place, and time. Epicritic sensation to light touch grossly present bilaterally.  Dermatologic Nails well groomed and normal in appearance. No open wounds. Callus present distal aspect of left 2nd toe related to hammertoe deformity  Orthopedic:  Hallux abductovalgus deformity present bilaterally left worse than right moderate to severe Left 1st MPJ diminished range of motion. Left 1st TMT with gross hypermobility. Right 1st MPJ diminished range of motion  Right 1st TMT with gross hypermobility. Lesser digital contractures present bilaterally.  Left second toe with hammertoe deformity that is rigid and painful hyperkeratotic lesion present at the medial aspect of the PIPJ.   Radiographs: Taken and reviewed. Hallux abductovalgus deformity present. Metatarsal  parabola normal. 1st/2nd IMA: 19 degrees bilaterally. Flexion contracture of the 2nd toe left foot. Bone spur noted at the medial aspect of the left 2nd PIPJ.   Assessment:   1. Hallux valgus, acquired, bilateral   2. Hammertoe of second toe of left foot   3. Acquired hammertoes of both feet   4. Callus of toe   5. Pain in left foot    Plan:  Patient was evaluated and treated and all questions answered.  #Hallux abductovalgus deformity, L > R #Left 2nd hammertoe deformity with medial PIPJ bone spur and resultant interdigital callus -XR  as above. Had extensive discussion with the patient regarding surgical management of the left foot. -Discussed ongoing conservative care which the patient appears to have failed and she is now interested in surgical management  -Discussed she has a moderate to severe bunion on the left foot but her concern is with pain in the left second toe at the medial aspect of the proximal phalangeal joint related to interdigital callus formation -I discussed that treatment of the hammertoe of the left second toe with resection of the bone spur alone would likely not resolve her problem fully.  This is due to the left hallux abutting the second toe due to hallux valgus deformity. -Discussed various surgical options for the bunion deformity.  I believe the best option for her would be for fusion of the first metatarsal phalangeal joint to hold the toe in a straightened position as well as decrease some of the medial prominence she has.  Additionally I would plan for second hammertoe repair with proximal interphalangeal joint arthroplasty without K wire fixation. -Patient states she is interested in proceeding with this option possibly in the winter in January.  She does want to obtain a second opinion prior to proceeding with his surgery.  I would like her to see Dr. Posey Pronto to further discuss surgical treatment options and see if he has anything different that he would recommend for the patient.  She is agreeable with this and will plan to set up follow-up appointment with Dr. Posey Pronto for second opinion of left forefoot surgical reconstruction for hallux abductovalgus and second hammertoe deformity.    Return in about 2 weeks (around 08/06/2022) for 2nd opinion Left foot surgery w Dr. Posey Pronto.

## 2022-08-06 ENCOUNTER — Ambulatory Visit: Payer: Medicare HMO | Admitting: Podiatry

## 2022-08-06 DIAGNOSIS — M2042 Other hammer toe(s) (acquired), left foot: Secondary | ICD-10-CM | POA: Diagnosis not present

## 2022-08-06 DIAGNOSIS — M2011 Hallux valgus (acquired), right foot: Secondary | ICD-10-CM

## 2022-08-06 DIAGNOSIS — M2012 Hallux valgus (acquired), left foot: Secondary | ICD-10-CM

## 2022-08-06 NOTE — Progress Notes (Signed)
Subjective:  Patient ID: Amy Richards, female    DOB: 1946/08/26,  MRN: [id number]  Chief Complaint  Patient presents with   Foot Pain    Left foot 2nd opinion    76 y.o. female presents with the above complaint.  Patient presents with a follow-up of left bunion deformity with second digit deformity.  Patient states that this is a second opinion.  She was seen by Dr. Loel Lofty.  She wanted evaluated and discuss any other treatment options that are available.  She states is not bothering her as much.  She has done shoe gear modification padding protecting.  Review of Systems: Negative except as noted in the HPI. Denies N/V/F/Ch.  Past Medical History:  Diagnosis Date   Dental crowns present    right upper   Foot drop, left    wears brace at night   Hypercholesteremia    Left leg numbness    s/p accident 2010, numb from knee to foot   MVP (mitral valve prolapse)    palpatations   PSVT (paroxysmal supraventricular tachycardia)     Current Outpatient Medications:    aspirin 81 MG EC tablet, Take by mouth., Disp: , Rfl:    atenolol (TENORMIN) 25 MG tablet, Take by mouth., Disp: , Rfl:    Cholecalciferol 125 MCG (5000 UT) capsule, Take by mouth., Disp: , Rfl:    Cobalamin Combinations (VITAMIN B12-FOLIC ACID) 277-412 MCG TABS, Vitamin B12, Disp: , Rfl:    Milk Thistle 140 MG CAPS, , Disp: , Rfl:    mineral oil liquid, Take by mouth., Disp: , Rfl:    Omega 3-6-9 CAPS, Take by mouth daily., Disp: , Rfl:    Probiotic Product (MISC INTESTINAL FLORA REGULAT) CAPS, Take 1 capsule by mouth daily., Disp: , Rfl:    UNABLE TO FIND, Mineral Rich - liquid juice, Disp: , Rfl:    vitamin B-12 (CYANOCOBALAMIN) 500 MCG tablet, Take by mouth., Disp: , Rfl:    Zinc Methionate 50 MG CAPS, Take by mouth., Disp: , Rfl:   Social History   Tobacco Use  Smoking Status Never  Smokeless Tobacco Never    No Known Allergies Objective:  There were no vitals filed for this visit. There is no  height or weight on file to calculate BMI. Constitutional Well developed. Well nourished.  Vascular Dorsalis pedis pulses palpable bilaterally. Posterior tibial pulses palpable bilaterally. Capillary refill normal to all digits.  No cyanosis or clubbing noted. Pedal hair growth normal.  Neurologic Normal speech. Oriented to person, place, and time. Epicritic sensation to light touch grossly present bilaterally.  Dermatologic Nails well groomed and normal in appearance. No open wounds. Callus present distal aspect of left 2nd toe related to hammertoe deformity  Orthopedic:  Hallux abductovalgus deformity present bilaterally left worse than right moderate to severe Left 1st MPJ diminished range of motion. Left 1st TMT with gross hypermobility. Right 1st MPJ diminished range of motion  Right 1st TMT with gross hypermobility. Lesser digital contractures present bilaterally.  Left second toe with hammertoe deformity that is rigid and painful hyperkeratotic lesion present at the medial aspect of the PIPJ.   Radiographs: Taken and reviewed. Hallux abductovalgus deformity present. Metatarsal parabola normal. 1st/2nd IMA: 19 degrees bilaterally. Flexion contracture of the 2nd toe left foot. Bone spur noted at the medial aspect of the left 2nd PIPJ.   Assessment:   No diagnosis found.  Plan:  Patient was evaluated and treated and all questions answered.  #Hallux abductovalgus deformity, L >  R #Left 2nd hammertoe deformity with medial PIPJ bone spur and resultant interdigital callus -I rediscussed the case with the patient in extensive detail.  Given the nature of the deformity that is present I believe patientBenefit from surgical fusion of the first metatarsophalangeal joint to reduce the IM angle as well as the address the underlying arthritic changes in the joint.  This will also help address the pressure against the second toe.  Patient will also benefit from second digit hammertoe  correction with arthroplasty with K wire fixation to hold it in place.  I discussed my surgical plans in extensive detail with the patient as well.  I agree with Dr. Loel Lofty with the surgical plan -I also gave her toe protectors and spacers to continue conservative care.  She will come back and see [place] to undergo surgical intervention when she is ready.    No follow-ups on file.

## 2022-09-22 ENCOUNTER — Ambulatory Visit: Payer: Medicare HMO | Admitting: Podiatry

## 2022-09-22 ENCOUNTER — Encounter: Payer: Self-pay | Admitting: Podiatry

## 2022-09-22 DIAGNOSIS — M79675 Pain in left toe(s): Secondary | ICD-10-CM

## 2022-09-22 DIAGNOSIS — B351 Tinea unguium: Secondary | ICD-10-CM

## 2022-09-22 DIAGNOSIS — L84 Corns and callosities: Secondary | ICD-10-CM

## 2022-09-22 DIAGNOSIS — M2012 Hallux valgus (acquired), left foot: Secondary | ICD-10-CM

## 2022-09-22 DIAGNOSIS — M79674 Pain in right toe(s): Secondary | ICD-10-CM

## 2022-09-22 DIAGNOSIS — M2042 Other hammer toe(s) (acquired), left foot: Secondary | ICD-10-CM

## 2022-09-22 DIAGNOSIS — M2011 Hallux valgus (acquired), right foot: Secondary | ICD-10-CM

## 2022-09-22 NOTE — Progress Notes (Signed)
This patient returns to the office for evaluation and treatment of long thick painful nails .  This patient is unable to trim her own nails since the patient cannot reach her feet.  Patient says the nails are painful walking and wearing her shoes.  She has painful corn between her 1/2 digits left foot.  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   She returns for preventive foot care services.  General Appearance  Alert, conversant and in no acute stress.  Vascular  Dorsalis pedis and posterior tibial  pulses are palpable  bilaterally.  Capillary return is within normal limits  bilaterally. Temperature is within normal limits  bilaterally.  Neurologic  Senn-Weinstein monofilament wire test within normal limits  right foot. LOPS diminished left foot.Muscle power within normal limits bilaterally.  Nails Thick disfigured discolored nails with subungual debris  from hallux to fifth toes bilaterally. No evidence of bacterial infection or drainage bilaterally.  Orthopedic  No limitations of motion  feet .  No crepitus or effusions noted.  No bony pathology .  HAV  B/L.  Hammer toes 2-5  B/L  Skin  normotropic skin with no porokeratosis noted bilaterally.  No signs of infections or ulcers noted.   [place] durum fifth toe left foot.  Corn hallux/second toe left foot.    Onychomycosis  Pain in toes right foot  Pain in toes left foot  Corn 1/2 left foot.  Debridement  of nails  1-5  B/L with a nail nipper.  Nails were then filed using a dremel tool with no incidents.  Debride corn with # 15 blade.  Padding dispensed.    RTC prn  Gardiner Barefoot DPM    Gardiner Barefoot DPM

## 2022-11-16 ENCOUNTER — Encounter: Payer: Self-pay | Admitting: Podiatry

## 2022-11-16 ENCOUNTER — Ambulatory Visit: Payer: Medicare HMO | Admitting: Podiatry

## 2022-11-16 VITALS — BP 137/76

## 2022-11-16 DIAGNOSIS — M79674 Pain in right toe(s): Secondary | ICD-10-CM

## 2022-11-16 DIAGNOSIS — B351 Tinea unguium: Secondary | ICD-10-CM

## 2022-11-16 DIAGNOSIS — L84 Corns and callosities: Secondary | ICD-10-CM | POA: Diagnosis not present

## 2022-11-16 DIAGNOSIS — G5792 Unspecified mononeuropathy of left lower limb: Secondary | ICD-10-CM | POA: Diagnosis not present

## 2022-11-16 DIAGNOSIS — M79675 Pain in left toe(s): Secondary | ICD-10-CM

## 2022-11-16 NOTE — Progress Notes (Signed)
  Subjective:  Patient ID: Amy Richards, female    DOB: 06/14/1946,  MRN: [id number]  Amy Richards presents to clinic today for corn(s) both feet, callus(es) both feet and painful mycotic nails.  Pain interferes with ambulation. Aggravating factors include wearing enclosed shoe gear. Painful toenails interfere with ambulation. Aggravating factors include wearing enclosed shoe gear. Pain is relieved with periodic professional debridement. Painful corns and calluses are aggravated when weightbearing with and without shoegear. Pain is relieved with periodic professional debridement.  Chief Complaint  Patient presents with   Nail Problem    RFC/Prediabetic A1C-6.0 PCP-Boyd PCP VST-Fall2023   New problem(s): None.   Patient states she did see both Drs. Standiford and Patel regarding her bunion deformity. She will continue conservative care for now. 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  PCP is Bartholome Bill, MD.  No Known Allergies  Review of Systems: Negative except as noted in the HPI.  Objective:  Vitals:   11/16/22 0910  BP: 137/76   Amy Richards is a pleasant 77 y.o. female WD, WN in NAD. AAO x 3.  Neurovascular Examination: CFT immediate b/l LE. Palpable DP/PT pulses b/l LE. Digital hair sparse b/l. Skin temperature gradient WNL b/l. No pain with calf compression b/l. No edema noted b/l. No cyanosis or clubbing noted b/l LE. Varicosities present b/l.  Pt has subjective symptoms of neuropathy. Protective sensation intact with 10 gram monofilament right lower extremity.   Protective sensation diminished with 10 gram monofilament left lower extremity. Dropfoot noted left lower extremity.  Dermatological:  Pedal integument with normal turgor, texture and tone b/l LE. No open wounds b/l. No interdigital macerations b/l.   Toenails 1-5 b/l elongated, thickened, discolored with subungual debris. +Tenderness with dorsal  palpation of nailplates.   Hyperkeratotic lesion(s) noted L hallux, L 2nd toe, medial IPJ of bilateral great toes.   Musculoskeletal:  Normal muscle strength 5/5 to all lower extremity muscle groups bilaterally. HAV with bunion deformity noted b/l LE. Hammertoe deformity noted 2-5 b/l.Marland Kitchen No pain, crepitus or joint limitation noted with ROM b/l LE.  Patient ambulates independently without assistive aids.  Assessment/Plan: 1. Pain due to onychomycosis of toenails of both feet   2. Corns and callosities   3. Neuropathy of left foot     -Toenails were debrided in length and girth 1-5 right foot, 3-5 left foot, and left great toe with sterile nail nippers and dremel without iatrogenic bleeding.  -Corn(s) left great toe and L 4th toe pared utilizing sharp debridement with sterile blade without complication or incident. Total number debrided=2. -Porokeratotic lesion(s) L 2nd toe pared and enucleated with sterile currette without incident. Total number of lesions debrided=1. -Patient/POA to call should there be question/concern in the interim.   Return in about 9 weeks (around 01/18/2023).  Marzetta Board, DPM

## 2022-12-08 DIAGNOSIS — K5909 Other constipation: Secondary | ICD-10-CM | POA: Insufficient documentation

## 2023-01-18 ENCOUNTER — Encounter: Payer: Self-pay | Admitting: Podiatry

## 2023-01-18 ENCOUNTER — Ambulatory Visit: Payer: Medicare HMO | Admitting: Podiatry

## 2023-01-18 VITALS — BP 151/66

## 2023-01-18 DIAGNOSIS — M79675 Pain in left toe(s): Secondary | ICD-10-CM | POA: Diagnosis not present

## 2023-01-18 DIAGNOSIS — B351 Tinea unguium: Secondary | ICD-10-CM

## 2023-01-18 DIAGNOSIS — G5792 Unspecified mononeuropathy of left lower limb: Secondary | ICD-10-CM

## 2023-01-18 DIAGNOSIS — M79674 Pain in right toe(s): Secondary | ICD-10-CM

## 2023-01-18 DIAGNOSIS — L84 Corns and callosities: Secondary | ICD-10-CM | POA: Diagnosis not present

## 2023-01-18 NOTE — Progress Notes (Signed)
  Subjective:  Patient ID: Amy Richards, female    DOB: November 17, 1945,  MRN: [id number]  Amy Richards presents to clinic today for at risk foot care with history of peripheral neuropathy and corn(s) both feet, callus(es) both feet and painful mycotic nails.  Pain interferes with ambulation. Aggravating factors include wearing enclosed shoe gear. Painful toenails interfere with ambulation. Aggravating factors include wearing enclosed shoe gear. Pain is relieved with periodic professional debridement. Painful corns and calluses are aggravated when weightbearing with and without shoegear. Pain is relieved with periodic professional debridement.  Chief Complaint  Patient presents with   Nail Problem    RFC PCP-Boyd PCP VST-10/2022   New problem(s): None.   PCP is Verlon Au, MD.  No Known Allergies  Review of Systems: Negative except as noted in the HPI.  Objective: No changes noted in today's physical examination. Vitals:   01/18/23 0912  BP: (!) 151/66   Amy Richards is a pleasant 77 y.o. female WD, WN in NAD. AAO x 3.  Neurovascular Examination: CFT immediate b/l LE. Palpable DP/PT pulses b/l LE. Digital hair sparse b/l. Skin temperature gradient WNL b/l. No pain with calf compression b/l. No edema noted b/l. No cyanosis or clubbing noted b/l LE. Varicosities present b/l.  Pt has subjective symptoms of neuropathy. Protective sensation intact with 10 gram monofilament right lower extremity.   Protective sensation diminished with 10 gram monofilament left lower extremity. Dropfoot noted left lower extremity.  Dermatological:  Pedal integument with normal turgor, texture and tone b/l LE. No open wounds b/l. No interdigital macerations b/l.   Toenails 1-5 b/l elongated, thickened, discolored with subungual debris. +Tenderness with dorsal palpation of nailplates.   Hyperkeratotic lesion(s) noted L hallux, L 2nd toe, medial IPJ of bilateral great toes.    Musculoskeletal:  Normal muscle strength 5/5 to all lower extremity muscle groups bilaterally. HAV with bunion b/l.  Assessment/Plan: 1. Pain due to onychomycosis of toenails of both feet   2. Corns and callosities   3. Neuropathy of left foot     -Consent given for treatment as described below: -Examined patient. -Mycotic toenails 1-5 bilaterally were debrided in length and girth with sterile nail nippers and dremel without iatrogenic bleeding. -Corn(s) R 2nd toe pared utilizing sterile scalpel blade without complication or incident. Total number debrided=1. -Callus(es) bilateral great toes pared utilizing sharp debridement with sterile blade without complication or incident. Total number debrided =2. -Patient/POA to call should there be question/concern in the interim.   Return in about 3 months (around 04/19/2023).  Freddie Breech, DPM

## 2023-03-15 ENCOUNTER — Emergency Department (HOSPITAL_COMMUNITY): Payer: Medicare HMO

## 2023-03-15 ENCOUNTER — Other Ambulatory Visit: Payer: Self-pay

## 2023-03-15 ENCOUNTER — Encounter (HOSPITAL_COMMUNITY): Payer: Self-pay | Admitting: *Deleted

## 2023-03-15 ENCOUNTER — Emergency Department (HOSPITAL_COMMUNITY)
Admission: EM | Admit: 2023-03-15 | Discharge: 2023-03-15 | Disposition: A | Discharge status: Home / Self Care | Payer: Medicare HMO | Attending: Emergency Medicine | Admitting: Emergency Medicine

## 2023-03-15 DIAGNOSIS — R1010 Upper abdominal pain, unspecified: Secondary | ICD-10-CM | POA: Diagnosis present

## 2023-03-15 DIAGNOSIS — K8020 Calculus of gallbladder without cholecystitis without obstruction: Secondary | ICD-10-CM | POA: Insufficient documentation

## 2023-03-15 LAB — URINALYSIS, ROUTINE W REFLEX MICROSCOPIC
Bilirubin Urine: NEGATIVE
Glucose, UA: NEGATIVE mg/dL
Ketones, ur: 5 mg/dL — AB
Leukocytes,Ua: NEGATIVE
Nitrite: NEGATIVE
Protein, ur: NEGATIVE mg/dL
Specific Gravity, Urine: 1.024 (ref 1.005–1.030)
pH: 5 (ref 5.0–8.0)

## 2023-03-15 LAB — COMPREHENSIVE METABOLIC PANEL
ALT: 18 U/L (ref 0–44)
AST: 16 U/L (ref 15–41)
Albumin: 3.9 g/dL (ref 3.5–5.0)
Alkaline Phosphatase: 39 U/L (ref 38–126)
Anion gap: 10 (ref 5–15)
BUN: 8 mg/dL (ref 8–23)
CO2: 24 mmol/L (ref 22–32)
Calcium: 9.3 mg/dL (ref 8.9–10.3)
Chloride: 101 mmol/L (ref 98–111)
Creatinine, Ser: 0.88 mg/dL (ref 0.44–1.00)
GFR, Estimated: >60 mL/min (ref >60)
Glucose, Bld: 110 mg/dL — ABNORMAL HIGH (ref 70–99)
Potassium: 3.9 mmol/L (ref 3.5–5.1)
Sodium: 135 mmol/L (ref 135–145)
Total Bilirubin: 1.7 mg/dL — ABNORMAL HIGH (ref 0.3–1.2)
Total Protein: 7.5 g/dL (ref 6.5–8.1)

## 2023-03-15 LAB — CBC
HCT: 37.2 % (ref 36.0–46.0)
Hemoglobin: 12.4 g/dL (ref 12.0–15.0)
MCH: 30.2 pg (ref 26.0–34.0)
MCHC: 33.3 g/dL (ref 30.0–36.0)
MCV: 90.7 fL (ref 80.0–100.0)
Platelets: 264 10*3/uL (ref 150–400)
RBC: 4.1 MIL/uL (ref 3.87–5.11)
RDW: 13.4 % (ref 11.5–15.5)
WBC: 6.3 10*3/uL (ref 4.0–10.5)
nRBC: 0 % (ref 0.0–0.2)

## 2023-03-15 LAB — LIPASE, BLOOD: Lipase: 25 U/L (ref 11–51)

## 2023-03-15 LAB — TROPONIN I (HIGH SENSITIVITY): Troponin I (High Sensitivity): 6 ng/L (ref <18)

## 2023-03-15 MED ORDER — ONDANSETRON 4 MG PO TBDP
4.0000 mg | ORAL_TABLET | Freq: Once | ORAL | Status: AC
  Administered 2023-03-15: 4 mg via ORAL
  Filled 2023-03-15: qty 1

## 2023-03-15 MED ORDER — IOHEXOL 350 MG/ML SOLN
75.0000 mL | Freq: Once | INTRAVENOUS | Status: AC | PRN
  Administered 2023-03-15: 75 mL via INTRAVENOUS

## 2023-03-15 MED ORDER — LACTATED RINGERS IV BOLUS: 1000.0000 mL | Freq: Once | INTRAVENOUS | Status: DC

## 2023-03-15 MED ORDER — ONDANSETRON 4 MG PO TBDP: 4.0000 mg | ORAL_TABLET | Freq: Three times a day (TID) | ORAL | 0 refills | Status: AC | PRN

## 2023-03-15 NOTE — ED Triage Notes (Signed)
Here by POV from home for RUQ/ epigastric abd pain. Onset last night. New onset. Endorses NV, constipation, fatigue, bloated and mildly distended. Denies diarrhea, weakness back pain, CP, bleeding or syncope. V x10 in last 24 hrs. Last BM 2d ago.

## 2023-03-15 NOTE — ED Provider Triage Note (Signed)
Emergency Medicine Provider Triage Evaluation Note  Mikailah V Zoll , a 77 y.o. female  was evaluated in triage.  Pt complains of abd pain. Report having nausea and persistent vomiting with constipation since yesterday.  Able to pass flatus.  Has hx of hysterectomy.  No prior hx of SBO.  No fever, chills, cp, sob, dysuria  Review of Systems  Positive: As above Negative: As above  Physical Exam  BP (!) 186/90   Pulse 81   Temp 98.5 F (36.9 C)   Resp 20   Ht 5\' 6"  (1.676 m)   Wt 79.4 kg   SpO2 98%   BMI 28.25 kg/m  Gen:   Awake, no distress   Resp:  Normal effort  MSK:   Moves extremities without difficulty  Other:    Medical Decision Making  Medically screening exam initiated at 2:14 PM.  Appropriate orders placed.  Francely V Roznowski was informed that the remainder of the evaluation will be completed by another provider, this initial triage assessment does not replace that evaluation, and the importance of remaining in the ED until their evaluation is complete.     Fayrene Helper, PA-C 03/15/23 1417

## 2023-03-15 NOTE — ED Provider Notes (Signed)
[place] EMERGENCY DEPARTMENT AT [facility] Provider Note   HPI: Amy Richards is a 77 year old female with a past medical history as below presenting today with abdominal pain.  She reports the pain started last night.  She reports it is primarily in the right upper quadrant.  She denies a history of a cholecystectomy.  She reports she has had constipation with associated nausea and vomiting.  She reports she has felt bloated and distended in her abdomen.  She has not had diarrhea.  She has not had chest pain or shortness of breath.  She denies any bloody bowel movements.  She reports her last bowel movement was approximately 2 days ago.  Due to worsening of her symptoms she presented to the emergency department.  Past Medical History:  Diagnosis Date   Dental crowns present    right upper   Foot drop, left    wears brace at night   Hypercholesteremia    Left leg numbness    s/p accident 2010, numb from knee to foot   MVP (mitral valve prolapse)    palpatations   PSVT (paroxysmal supraventricular tachycardia)     Past Surgical History:  Procedure Laterality Date   BARTHOLIN GLAND CYST EXCISION     BROW LIFT Bilateral 11/05/2015   Procedure: BLEPHAROPLASTY UPPER WITH EXCESS SKIN AND LOWER COSMETIC;  Surgeon: Imagene Riches, MD;  Location: [facility];  Service: Ophthalmology;  Laterality: Bilateral;   CHALAZION EXCISION Bilateral 11/05/2015   Procedure: EXCISION CHALAZION LEFT UPPER LID;  Surgeon: Imagene Riches, MD;  Location: [facility];  Service: Ophthalmology;  Laterality: Bilateral;   COLONOSCOPY     HERNIA REPAIR     hiatel hernia   PTOSIS REPAIR Bilateral 11/05/2015   Procedure: PTOSIS REPAIR;  Surgeon: Imagene Riches, MD;  Location: [facility];  Service: Ophthalmology;  Laterality: Bilateral;   THROAT SURGERY     excision of nodules from vocal cords   UTERINE ARTERY EMBOLIZATION  1998     Social History   Tobacco Use   Smoking  status: Never   Smokeless tobacco: Never  Substance Use Topics   Alcohol use: Yes    Alcohol/week: 3.0 standard drinks of alcohol    Types: 3 Glasses of wine per week      Review of Systems  A complete ROS was performed with pertinent positives/negatives noted in the HPI.   Vitals:   03/15/23 1405 03/15/23 2032  BP: (!) 186/90 (!) 175/80  Pulse: 81 80  Resp: 20 18  Temp: 98.5 F (36.9 C) 98.3 F (36.8 C)  SpO2: 98% 99%    Physical Exam Vitals and nursing note reviewed.  Constitutional:      General: She is not in acute distress.    Appearance: She is well-developed.  HENT:     Head: Normocephalic and atraumatic.  Eyes:     Conjunctiva/sclera: Conjunctivae normal.  Cardiovascular:     Heart sounds: No murmur heard. Pulmonary:     Effort: Pulmonary effort is normal. No respiratory distress.  Abdominal:     Palpations: Abdomen is soft.     Tenderness: There is abdominal tenderness in the right upper quadrant. There is no guarding or rebound. Negative signs include Murphy's sign.  Musculoskeletal:        General: No swelling.  Skin:    General: Skin is warm and dry.     Capillary Refill: Capillary refill takes less than 2 seconds.  Neurological:  Mental Status: She is alert.     Procedures  MDM:  Imaging/radiology results:  CT ABDOMEN PELVIS W CONTRAST  Result Date: 03/15/2023 CLINICAL DATA:  Right upper quadrant pain EXAM: CT ABDOMEN AND PELVIS WITH CONTRAST TECHNIQUE: Multidetector CT imaging of the abdomen and pelvis was performed using the standard protocol following bolus administration of intravenous contrast. RADIATION DOSE REDUCTION: This exam was performed according to the departmental dose-optimization program which includes automated exposure control, adjustment of the mA and/or kV according to patient size and/or use of iterative reconstruction technique. CONTRAST:  75mL OMNIPAQUE IOHEXOL 350 MG/ML SOLN COMPARISON:  02/20/2022 FINDINGS: Lower chest:  No acute abnormality. Hepatobiliary: Fatty infiltration of the liver is noted. Gallbladder is well distended with multiple gallstones. Some suggestion of gallbladder wall thickening is noted although no inflammatory changes are seen. Pancreas: Unremarkable. No pancreatic ductal dilatation or surrounding inflammatory changes. Spleen: Normal in size without focal abnormality. Adrenals/Urinary Tract: Adrenal glands are within normal limits. Kidneys demonstrate no renal calculi or obstructive changes. Large 6.9 cm cyst is noted in the left kidney. This is simple in nature no further follow-up is recommended. Bladder is partially distended. Stomach/Bowel: Scattered diverticular change of the colon is noted without evidence of diverticulitis. No obstructive changes of the colon are seen. The appendix is within normal limits. Small bowel and stomach are unremarkable. Vascular/Lymphatic: Aortic atherosclerosis. No enlarged abdominal or pelvic lymph nodes. Reproductive: Status post hysterectomy. No adnexal masses. Other: No abdominal wall hernia or abnormality. No abdominopelvic ascites. Musculoskeletal: No acute or significant osseous findings. IMPRESSION: Diverticulosis without diverticulitis. Cholelithiasis with mild suggestion of wall thickening although no inflammatory changes are seen. Ultrasound may be helpful for further evaluation. Electronically Signed   By: Alcide Clever M.D.   On: 03/15/2023 19:18   DG ABD ACUTE 2+V W 1V CHEST  Result Date: 03/15/2023 CLINICAL DATA:  Constipation, nausea, and vomiting EXAM: DG ABDOMEN ACUTE WITH 1 VIEW CHEST COMPARISON:  Chest radiograph dated 07/23/2020, CT abdomen and pelvis dated 02/20/2022 FINDINGS: There is no evidence of dilated bowel loops or free intraperitoneal air. Moderate volume stool within the colon. No radiopaque calculi. Rounded radiodensity projecting over the left upper quadrant likely corresponds to known left renal cyst. Heart size and mediastinal contours  are within normal limits. Both lungs are clear. Surgical clips and embolization coils in the pelvis. IMPRESSION: 1. Nonobstructive bowel gas pattern. Moderate volume stool within the colon. 2. No acute cardiopulmonary process. Electronically Signed   By: Agustin Cree M.D.   On: 03/15/2023 15:33     EKG results: ECG on my interpretation is normal sinus rhythm and rate, without anatomical ischemia representing STEMI, New-onset Arrhythmia, or ischemic equivalent.    Lab results:  Results for orders placed or performed during the hospital encounter of 03/15/23 (from the past 24 hour(s))  Lipase, blood     Status: None   Collection Time: 03/15/23  2:27 PM  Result Value Ref Range   Lipase 25 11 - 51 U/L  Comprehensive metabolic panel     Status: Abnormal   Collection Time: 03/15/23  2:27 PM  Result Value Ref Range   Sodium 135 135 - 145 mmol/L   Potassium 3.9 3.5 - 5.1 mmol/L   Chloride 101 98 - 111 mmol/L   CO2 24 22 - 32 mmol/L   Glucose, Bld 110 (H) 70 - 99 mg/dL   BUN 8 8 - 23 mg/dL   Creatinine, Ser 0.86 0.44 - 1.00 mg/dL   Calcium 9.3 8.9 -  10.3 mg/dL   Total Protein 7.5 6.5 - 8.1 g/dL   Albumin 3.9 3.5 - 5.0 g/dL   AST 16 15 - 41 U/L   ALT 18 0 - 44 U/L   Alkaline Phosphatase 39 38 - 126 U/L   Total Bilirubin 1.7 (H) 0.3 - 1.2 mg/dL   GFR, Estimated >29 >52 mL/min   Anion gap 10 5 - 15  CBC     Status: None   Collection Time: 03/15/23  2:27 PM  Result Value Ref Range   WBC 6.3 4.0 - 10.5 K/uL   RBC 4.10 3.87 - 5.11 MIL/uL   Hemoglobin 12.4 12.0 - 15.0 g/dL   HCT 84.1 32.4 - 40.1 %   MCV 90.7 80.0 - 100.0 fL   MCH 30.2 26.0 - 34.0 pg   MCHC 33.3 30.0 - 36.0 g/dL   RDW 02.7 25.3 - 66.4 %   Platelets 264 150 - 400 K/uL   nRBC 0.0 0.0 - 0.2 %  Troponin I (High Sensitivity)     Status: None   Collection Time: 03/15/23  2:27 PM  Result Value Ref Range   Troponin I (High Sensitivity) 6 <18 ng/L  Urinalysis, Routine w reflex microscopic -Urine, Clean Catch     Status: Abnormal    Collection Time: 03/15/23  3:24 PM  Result Value Ref Range   Color, Urine YELLOW YELLOW   APPearance CLEAR CLEAR   Specific Gravity, Urine 1.024 1.005 - 1.030   pH 5.0 5.0 - 8.0   Glucose, UA NEGATIVE NEGATIVE mg/dL   Hgb urine dipstick MODERATE (A) NEGATIVE   Bilirubin Urine NEGATIVE NEGATIVE   Ketones, ur 5 (A) NEGATIVE mg/dL   Protein, ur NEGATIVE NEGATIVE mg/dL   Nitrite NEGATIVE NEGATIVE   Leukocytes,Ua NEGATIVE NEGATIVE   RBC / HPF 21-50 0 - 5 RBC/hpf   WBC, UA 0-5 0 - 5 WBC/hpf   Bacteria, UA RARE (A) NONE SEEN   Squamous Epithelial / HPF 0-5 0 - 5 /HPF   Mucus PRESENT      Key medications administered in the ER:  Medications  ondansetron (ZOFRAN-ODT) disintegrating tablet 4 mg (4 mg Oral Given 03/15/23 1644)  iohexol (OMNIPAQUE) 350 MG/ML injection 75 mL (75 mLs Intravenous Contrast Given 03/15/23 1842)   Medical decision making: -Vital signs stable. Patient afebrile, hemodynamically stable, and non-toxic appearing. -Patient's presentation is most consistent with acute presentation with potential threat to life or bodily function.. -Grecia V Nicklaus is a 77 y.o. female presenting to the emergency department with abdominal pain.  -Additional history obtained from patient's husband at bedside. -Emergent causes of abdominal pain considered. Doubt appendicitis given no ct findings.  No flank pain/CVA tenderness as well as no CT findings to suggest nephrolithiasis/pyelonephritis.  UA without UTI.  Do not suspect pyelonephritis/UTI. No physical exam findings concern for hernia or torsion.  Mesenteric ischemia considered; however, less likely given pain is not out of proportion with physical exam and no CT findings.  Lipase normal do not suspect pancreatitis.  Bowel obstruction considered, but unlikely given negative CT imaging. ACS considered, less likely given no chest pain and no emergent EKG changes and normal troponin. Given that patient is female, ovarian torsion, ectopic  pregnancy, PID versus STI considered; however, considered less likely given patient is postmenopausal, no new vaginal discharge or bleeding, and no pelvic pain.  CT scan shows evidence of cholelithiasis with possibly slightly enlarged and thickened gallbladder wall.  Upon my reassessment, patient has had resolution of her abdominal  pain.  I informed the patient that we could perform ultrasound to further evaluate for acute cholecystitis however she feels that since her pain is resolved she would prefer to pursue an outpatient workup.  I believe her presentation is more consistent with symptomatic cholelithiasis and given resolution of pain this is reasonable.  Repeat exam shows no pain in the right upper quadrant.  Negative Murphy.  I did inform her to come back immediately to the emergency department should she have return of her pain as this can be a sign of acute cholecystitis.  Will plan for surgery follow-up. -Patient safe for discharge home. Discussed clinical impression and recommendations with the patient at bedside, who voiced understanding and agreement with plan.  Discussed symptomatic treatment and encouraged hydration.  Patient given my usual and customary discussion regarding strict return precautions.  Discussed red flags which would necessitate return to ED for further evaluation and encouraged follow up with general surgery.  Any remaining questions or concerns were solicited and addressed prior to discharge.    Medical Decision Making Amount and/or Complexity of Data Reviewed Labs: ordered. Decision-making details documented in ED Course. Radiology: ordered. Decision-making details documented in ED Course. ECG/medicine tests: ordered and independent interpretation performed. Decision-making details documented in ED Course.  Risk Prescription drug management.     The plan for this patient was discussed with Dr. Jodi Mourning, who voiced agreement and who oversaw evaluation and treatment of  this patient.  Marta Lamas, MD Emergency Medicine, PGY-3  Note: Dragon medical dictation software was used in the creation of this note.   Clinical Impression:  1. Calculus of gallbladder without cholecystitis without obstruction     Rx / DC Orders ED Discharge Orders          Ordered    ondansetron (ZOFRAN-ODT) 4 MG disintegrating tablet  Every 8 hours PRN        03/15/23 1939               Chase Caller, MD 03/16/23 1159    Blane Ohara, MD 03/18/23 [phone]

## 2023-03-15 NOTE — ED Notes (Signed)
Patient transported to CT 

## 2023-03-15 NOTE — Discharge Instructions (Signed)
Amy Richards:  Thank you for allowing [place] to take care of you today.  We hope you begin feeling better soon.  To-Do: Please follow-up with surgery.  Call the number provided to set up an appointment. Take Zofran as needed for nausea/vomiting however if you are having worsening right upper quadrant pain with nausea and vomiting you need to present to an emergency department as this could be a surgical emergency. Please return to the Emergency Department or call 911 if you experience chest pain, shortness of breath, severe pain, severe fever, altered mental status, or have any reason to think that you need emergency medical care.  Thank you again.  Hope you feel better soon.  Department of Emergency Medicine [facility]

## 2023-03-22 ENCOUNTER — Ambulatory Visit: Payer: Medicare HMO | Admitting: Podiatry

## 2023-03-22 DIAGNOSIS — L84 Corns and callosities: Secondary | ICD-10-CM

## 2023-03-22 DIAGNOSIS — B351 Tinea unguium: Secondary | ICD-10-CM

## 2023-03-22 DIAGNOSIS — B353 Tinea pedis: Secondary | ICD-10-CM | POA: Diagnosis not present

## 2023-03-22 DIAGNOSIS — M79675 Pain in left toe(s): Secondary | ICD-10-CM

## 2023-03-22 DIAGNOSIS — M79674 Pain in right toe(s): Secondary | ICD-10-CM | POA: Diagnosis not present

## 2023-03-22 NOTE — Progress Notes (Signed)
    Subjective:  Patient ID: Amy Richards, female    DOB: [birth year],  MRN: [id number]   Amy Richards presents to clinic today for:  Chief Complaint  Patient presents with   Nail Problem    Routine foot care    Patient notes nails are thick, discolored, elongated and painful in shoegear when trying to ambulate.  Patient states that she has some corns and calluses.  When asked where, she just needed to look at her foot and find them myself and shave whatever needs shaved  PCP is Verlon Au, MD.  No Known Allergies  Review of Systems: Negative except as noted in the HPI.  Objective:  There were no vitals filed for this visit.  Amy Richards is a pleasant 77 y.o. female in NAD. AAO x 3.  Vascular Examination: Capillary refill time is 3-5 seconds to toes bilateral. Palpable pedal pulses b/l LE. Digital hair present b/l. No pedal edema b/l. Skin temperature gradient WNL b/l. No varicosities b/l. No cyanosis or clubbing noted b/l.   Dermatological Examination: Pedal skin with normal turgor, texture and tone b/l. No open wounds. No interdigital macerations b/l. Toenails x10 are 3mm thick, discolored, dystrophic with subungual debris. There is pain with compression of the nail plates.  They are elongated x10.  There is mild maceration in the left fourth interspace consistent with tinea pedis.  There are hyperkeratotic lesions submet 2 bilateral, bilateral plantar medial aspect of the hallux IPJ, and the left medial second toe at the PIP joint.  No ulceration is noted.  Neurological Examination: Epicritic sensation intact bilateral LE.  Musculoskeletal Examination: Muscle strength 5/5 to all LE muscle groups b/l.  There is hallux valgus with bunion deformity bilateral  Assessment/Plan: 1. Pain due to onychomycosis of toenails of both feet   2. Corns and callosities   3. Tinea pedis of left foot     The mycotic toenails were sharply debrided x10 with sterile nail  nippers and a power debriding burr to decrease bulk/thickness and length.    Patient was advised to purchase over-the-counter Lamisil cream to apply into the left fourth interspace.  Also instructed patient to keep the interspaces dry.  The hyperkeratotic lesions were shaved with a sterile #312 blade uneventfully.  Return in about 3 months (around 06/22/2023) for RFC.   Clerance Lav, DPM, FACFAS [facility]     [address] [place], [place] [address]                Office [phone]  Fax [phone]

## 2023-05-25 ENCOUNTER — Ambulatory Visit: Payer: Medicare HMO | Admitting: Podiatry

## 2023-06-22 ENCOUNTER — Encounter: Payer: Self-pay | Admitting: Podiatry

## 2023-06-22 ENCOUNTER — Ambulatory Visit: Payer: Medicare HMO | Admitting: Podiatry

## 2023-06-22 DIAGNOSIS — G5792 Unspecified mononeuropathy of left lower limb: Secondary | ICD-10-CM | POA: Diagnosis not present

## 2023-06-22 DIAGNOSIS — M79674 Pain in right toe(s): Secondary | ICD-10-CM | POA: Diagnosis not present

## 2023-06-22 DIAGNOSIS — L84 Corns and callosities: Secondary | ICD-10-CM | POA: Diagnosis not present

## 2023-06-22 DIAGNOSIS — M79675 Pain in left toe(s): Secondary | ICD-10-CM | POA: Diagnosis not present

## 2023-06-22 DIAGNOSIS — B351 Tinea unguium: Secondary | ICD-10-CM | POA: Diagnosis not present

## 2023-06-23 NOTE — Progress Notes (Signed)
Subjective:  Patient ID: Amy Richards, female    DOB: June 01, 1946,  MRN: [id number]  Amy Richards presents to clinic today for at risk foot care with history of peripheral neuropathy and corn(s) of both feet and painful thick toenails that are difficult to trim. Painful toenails interfere with ambulation. Aggravating factors include wearing enclosed shoe gear. Pain is relieved with periodic professional debridement. Painful corns are aggravated when weightbearing when wearing enclosed shoe gear. Pain is relieved with periodic professional debridement.  Chief Complaint  Patient presents with   RFC    RFC   New problem(s): None.   PCP is Verlon Au, MD.  No Known Allergies  Review of Systems: Negative except as noted in the HPI.  Objective: No changes noted in today's physical examination. There were no vitals filed for this visit. Amy Richards is a pleasant 77 y.o. female WD, WN in NAD. AAO x 3.  Neurovascular Examination: CFT immediate b/l LE. Palpable DP/PT pulses b/l LE. Digital hair sparse b/l. Skin temperature gradient WNL b/l. No pain with calf compression b/l. No edema noted b/l. No cyanosis or clubbing noted b/l LE. Varicosities present b/l.  Pt has subjective symptoms of neuropathy. Protective sensation intact with 10 gram monofilament right lower extremity.   Protective sensation diminished with 10 gram monofilament left lower extremity. Dropfoot noted left lower extremity.  Dermatological:  Pedal integument with normal turgor, texture and tone b/l LE. No open wounds b/l. No interdigital macerations b/l.   Toenails 1-5 b/l elongated, thickened, discolored with subungual debris. +Tenderness with dorsal palpation of nailplates.   Hyperkeratotic lesion(s) medial aspect 2nd toe PIPJ b/l.  Musculoskeletal:  Normal muscle strength 5/5 to all lower extremity muscle groups bilaterally. HAV with bunion b/l. Hammertoes 2-5 b/l.  Assessment/Plan: 1. Pain  due to onychomycosis of toenails of both feet   2. Corns   3. Neuropathy of left foot    -Consent given for treatment as described below: -Examined patient. -Continue padding of interdigital corns, especially when wearing enclosed shoe gear. -Patient to continue soft, supportive shoe gear daily. -Toenails 1-5 b/l were debrided in length and girth with sterile nail nippers and dremel without iatrogenic bleeding.  -Corn(s) bilateral 2nd toes pared utilizing sterile scalpel blade without complication or incident. Total number debrided=2. -Patient/POA to call should there be question/concern in the interim.   Return in about 9 weeks (around 08/24/2023).  Freddie Breech, DPM

## 2023-07-27 ENCOUNTER — Ambulatory Visit: Payer: Medicare HMO | Admitting: Podiatry

## 2023-08-19 LAB — COLOGUARD: COLOGUARD: NEGATIVE

## 2023-09-15 ENCOUNTER — Ambulatory Visit: Payer: Medicare HMO | Admitting: Podiatry

## 2023-09-28 ENCOUNTER — Ambulatory Visit: Payer: Medicare HMO | Admitting: Podiatry

## 2023-09-28 ENCOUNTER — Encounter: Payer: Self-pay | Admitting: Podiatry

## 2023-09-28 VITALS — Ht 66.0 in | Wt 175.0 lb

## 2023-09-28 DIAGNOSIS — L84 Corns and callosities: Secondary | ICD-10-CM

## 2023-09-28 DIAGNOSIS — I739 Peripheral vascular disease, unspecified: Secondary | ICD-10-CM

## 2023-09-28 DIAGNOSIS — M79674 Pain in right toe(s): Secondary | ICD-10-CM

## 2023-09-28 DIAGNOSIS — M79675 Pain in left toe(s): Secondary | ICD-10-CM

## 2023-09-28 DIAGNOSIS — B351 Tinea unguium: Secondary | ICD-10-CM

## 2023-09-28 NOTE — Progress Notes (Signed)
    Subjective:  Patient ID: Amy Richards, female    DOB: 1946-04-27,  MRN: [id number]  Amy Richards presents to clinic today for:  Chief Complaint  Patient presents with   Nail Problem    Patient is here for routine foot care, has ingrown on left 2nd toe not sure if she wants it worked on yet    Patient notes nails are thick and elongated, causing pain in shoe gear when ambulating.  Patient has painful calluses on bilateral hallux, bilateral submet 1, bilateral submet 2, dorsal left fourth toe PIPJ, bilateral first interspace.  PCP is Jolee Madelin Patch, MD. date last seen was 06/11/2023  Past Medical History:  Diagnosis Date   Dental crowns present    right upper   Foot drop, left    wears brace at night   Hypercholesteremia    Left leg numbness    s/p accident 2010, numb from knee to foot   MVP (mitral valve prolapse)    palpatations   PSVT (paroxysmal supraventricular tachycardia) (HCC)     No Known Allergies  Objective:  Amy Richards is a pleasant 77 y.o. female in NAD. AAO x 3.  Vascular Examination: Patient has palpable DP pulse, absent PT pulse bilateral.  Delayed capillary refill bilateral toes.  Sparse digital hair bilateral.  Proximal to distal cooling WNL bilateral.    Dermatological Examination: Interspaces are clear with no open lesions noted bilateral.  Skin is shiny and atrophic bilateral.  Nails are 3-69mm thick, with yellowish/brown discoloration, subungual debris and distal onycholysis x10.  There is pain with compression of nails x10.  There are hyperkeratotic lesions noted bilateral plantar medial hallux IPJ, bilateral submet 1, bilateral submet 2, dorsal left fourth toe PIPJ, bilateral fourth interspace proximal to the DIPJ.  Patient qualifies for at-risk foot care because of PVD.  Assessment/Plan: 1. Pain due to onychomycosis of toenails of both feet   2. Corns and callosities   3. PVD (peripheral vascular disease) (HCC)    Mycotic  nails x10 were sharply debrided with sterile nail nippers and power debriding burr to decrease bulk and length.  Hyperkeratotic lesions x 9 were shaved with #312 blade.  Return in about 3 months (around 12/27/2023) for [facility].   Awanda CHARM Imperial, DPM, FACFAS [facility]     [address] [place], [place] [address]                Office [phone]  Fax [phone]

## 2023-11-30 ENCOUNTER — Ambulatory Visit: Payer: Medicare HMO | Admitting: Podiatry

## 2023-11-30 ENCOUNTER — Encounter: Payer: Self-pay | Admitting: Podiatry

## 2023-11-30 VITALS — Ht 66.5 in | Wt 175.0 lb

## 2023-11-30 DIAGNOSIS — M79675 Pain in left toe(s): Secondary | ICD-10-CM

## 2023-11-30 DIAGNOSIS — M79674 Pain in right toe(s): Secondary | ICD-10-CM | POA: Diagnosis not present

## 2023-11-30 DIAGNOSIS — L84 Corns and callosities: Secondary | ICD-10-CM | POA: Diagnosis not present

## 2023-11-30 DIAGNOSIS — G5792 Unspecified mononeuropathy of left lower limb: Secondary | ICD-10-CM | POA: Diagnosis not present

## 2023-11-30 DIAGNOSIS — B351 Tinea unguium: Secondary | ICD-10-CM

## 2023-12-05 NOTE — Progress Notes (Signed)
  Subjective:  Patient ID: Amy Richards, female    DOB: 10-31-1945,  MRN: [id number]  78 y.o. female presents at risk foot care with history of peripheral neuropathy and corn(s) b/l feet and painful mycotic toenails that are difficult to trim. Painful toenails interfere with ambulation. Aggravating factors include wearing enclosed shoe gear. Pain is relieved with periodic professional debridement. Painful corns are aggravated when weightbearing when wearing enclosed shoe gear. Pain is relieved with periodic professional debridement. Chief Complaint  Patient presents with   RFC    She is here for a nail trim and callous trim as well, PCP is Amy Richards and seen once a year,    New problem(s): None   PCP is Amy Au, MD , and last visit was June 11, 2023.  No Known Allergies  Review of Systems: Negative except as noted in the HPI.   Objective:  Amy Richards is a pleasant 78 y.o. female WD, WN in NAD. AAO x 3.  Neurovascular Examination: CFT immediate b/l LE. Palpable DP/PT pulses b/l LE. Digital hair sparse b/l. Skin temperature gradient WNL b/l. No pain with calf compression b/l. No edema noted b/l. No cyanosis or clubbing noted b/l LE. Varicosities present b/l.  Pt has subjective symptoms of neuropathy. Protective sensation intact with 10 gram monofilament right lower extremity.   Protective sensation diminished with 10 gram monofilament left lower extremity. Dropfoot noted left lower extremity.  Dermatological:  Pedal integument with normal turgor, texture and tone b/l LE. No open wounds b/l. No interdigital macerations b/l.   Toenails 1-5 b/l elongated, thickened, discolored with subungual debris. +Tenderness with dorsal palpation of nailplates.   Hyperkeratotic lesion(s) medial aspect 2nd toe PIPJ b/l.  Musculoskeletal:  Normal muscle strength 5/5 to all lower extremity muscle groups bilaterally. HAV with bunion b/l. Hammertoes 2-5 b/l.  Radiographs:  None  Last A1c:       No data to display         Assessment:   1. Pain due to onychomycosis of toenails of both feet   2. Corns   3. Neuropathy of left foot    Plan:  -Patient was evaluated today. All questions/concerns addressed on today's visit. -Continue supportive shoe gear daily. -Mycotic toenails 1-5 bilaterally were debrided in length and girth with sterile nail nippers and dremel without incident. -Corn(s) L 2nd toe and R 2nd toe pared utilizing sterile scalpel blade without complication or incident. Total number debrided=2. -Patient/POA to call should there be question/concern in the interim.  Return in about 9 weeks (around 02/01/2024).  Amy Richards, DPM       LOCATION: [address], [place] [address]                   Office [phone]   [facility] LOCATION: [address] [place], [place] [address] Office [phone]

## 2024-02-01 ENCOUNTER — Encounter: Payer: Self-pay | Admitting: Podiatry

## 2024-02-01 ENCOUNTER — Ambulatory Visit: Payer: Medicare HMO | Admitting: Podiatry

## 2024-02-01 VITALS — Ht 66.5 in | Wt 175.0 lb

## 2024-02-01 DIAGNOSIS — M79674 Pain in right toe(s): Secondary | ICD-10-CM

## 2024-02-01 DIAGNOSIS — B351 Tinea unguium: Secondary | ICD-10-CM

## 2024-02-01 DIAGNOSIS — G5792 Unspecified mononeuropathy of left lower limb: Secondary | ICD-10-CM | POA: Diagnosis not present

## 2024-02-01 DIAGNOSIS — L84 Corns and callosities: Secondary | ICD-10-CM | POA: Diagnosis not present

## 2024-02-01 DIAGNOSIS — M79675 Pain in left toe(s): Secondary | ICD-10-CM | POA: Diagnosis not present

## 2024-02-06 NOTE — Progress Notes (Signed)
  Subjective:  Patient ID: Amy Richards, female    DOB: 26-Sep-1946,  MRN: [id number]  Amy Richards presents to clinic today for at risk foot care with history of peripheral neuropathy and corn(s) of both feet, callus(es) of both feet and painful elongated, discolored, dystrophic nails.  Pain interferes with ambulation. Aggravating factors include wearing enclosed shoe gear. Painful toenails interfere with ambulation. Aggravating factors include wearing enclosed shoe gear. Pain is relieved with periodic professional debridement. Painful corns and calluses are aggravated when weightbearing with and without shoegear. Pain is relieved with periodic professional debridement.  Chief Complaint  Patient presents with   Nail Problem    Pt is here for [facility] PCP is Dr Suszanne Eriksson and LOV was in September.   New problem(s): None.   PCP is Jacqulyne Maxim, MD.  No Known Allergies  Review of Systems: Negative except as noted in the HPI.  Objective: No changes noted in today's physical examination. There were no vitals filed for this visit. Amy Richards is a pleasant 78 y.o. female WD, WN in NAD. AAO x 3.  Neurovascular Examination: CFT immediate b/l LE. Palpable DP/PT pulses b/l LE. Digital hair sparse b/l. Skin temperature gradient WNL b/l. No pain with calf compression b/l. No edema noted b/l. No cyanosis or clubbing noted b/l LE. Varicosities present b/l.  Pt has subjective symptoms of neuropathy. Protective sensation intact with 10 gram monofilament right lower extremity.   Protective sensation diminished with 10 gram monofilament left lower extremity. Dropfoot noted left lower extremity.  Dermatological:  Pedal integument with normal turgor, texture and tone b/l LE. No open wounds b/l. No interdigital macerations b/l.   Toenails 1-5 b/l elongated, thickened, discolored with subungual debris. +Tenderness with dorsal palpation of nailplates.   Hyperkeratotic lesion(s) medial aspect 2nd  toe PIPJ b/l, medial IPJ of bilateral great toes, submet head 2 left foot .   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  Radiographs: None  Assessment/Plan: 1. Pain due to onychomycosis of toenails of both feet   2. Corns and callosities   3. Neuropathy of left foot   Consent given for treatment. Patient examined. All patient's and/or POA's questions/concerns addressed on today's visit. Toenails 1-5 debrided in length and girth without incident. Corn(s) and callus(es) bilateral great toes, bilateral 2nd toes, and submet head 2 left foot pared with sharp debridement without incident. Continue foot and shoe inspections daily. Monitor blood glucose per PCP/Endocrinologist's recommendations.Continue soft, supportive shoe gear daily. Report any pedal injuries to medical professional. Call office if there are any questions/concerns. -Patient/POA to call should there be question/concern in the interim.   Return in about 9 weeks (around 04/04/2024).  Amy Richards, DPM      [place] LOCATION: [address], [place] [address]                   Office [phone]   [facility] LOCATION: [address] [place], [place] [address] Office [phone]

## 2024-03-27 ENCOUNTER — Ambulatory Visit: Payer: Medicare HMO | Admitting: Podiatry

## 2024-04-04 ENCOUNTER — Ambulatory Visit (INDEPENDENT_AMBULATORY_CARE_PROVIDER_SITE_OTHER): Admitting: Podiatry

## 2024-04-04 ENCOUNTER — Encounter: Payer: Self-pay | Admitting: Podiatry

## 2024-04-04 DIAGNOSIS — I739 Peripheral vascular disease, unspecified: Secondary | ICD-10-CM

## 2024-04-04 DIAGNOSIS — M79674 Pain in right toe(s): Secondary | ICD-10-CM | POA: Diagnosis not present

## 2024-04-04 DIAGNOSIS — L84 Corns and callosities: Secondary | ICD-10-CM | POA: Diagnosis not present

## 2024-04-04 DIAGNOSIS — G5792 Unspecified mononeuropathy of left lower limb: Secondary | ICD-10-CM | POA: Diagnosis not present

## 2024-04-04 DIAGNOSIS — M79675 Pain in left toe(s): Secondary | ICD-10-CM | POA: Diagnosis not present

## 2024-04-04 DIAGNOSIS — B351 Tinea unguium: Secondary | ICD-10-CM | POA: Diagnosis not present

## 2024-04-09 ENCOUNTER — Encounter: Payer: Self-pay | Admitting: Podiatry

## 2024-04-09 NOTE — Progress Notes (Signed)
 Subjective:  Patient ID: Amy Richards, female    DOB: Apr 02, 1946,  MRN: [id number]  Amy Richards presents to clinic today for at risk foot care with history of peripheral neuropathy and corn(s) b/l feet, callus(es) b/l feet and painful elongated, discolored, dystrophic nails.  Pain interferes with ambulation. Aggravating factors include wearing enclosed shoe gear. Painful toenails interfere with ambulation. Aggravating factors include wearing enclosed shoe gear. Pain is relieved with periodic professional debridement. Painful corns and calluses are aggravated when weightbearing with and without shoegear. Pain is relieved with periodic professional debridement.  Chief Complaint  Patient presents with   RFC    RFC Non diabetic. Thick toenails and callus care.   New problem(s): None.   PCP is Jolee Madelin Patch, MD. ARNETTA 0913/2024.  No Known Allergies  Review of Systems: Negative except as noted in the HPI.  Objective: No changes noted in today's physical examination. There were no vitals filed for this visit. Amy Richards is a pleasant 78 y.o. female WD, WN in NAD. AAO x 3.  Vascular Examination: CFT less than 3 seconds. DP pulses 2/4 b/l. PT pulses 0/4  b/l. Digital hair absent. Skin temperature gradient warm to warn b/l. No ischemia or gangrene. No cyanosis or clubbing noted b/l. No edema noted b/l LE.   Neurological Examination: Pt has subjective symptoms of neuropathy. Protective sensation intact with 10 gram monofilament right foot. Protective sensation diminished with 10 gram monofilament left foot. Dropfoot noted left foot.  Dermatological Examination: Pedal skin thin, shiny and atrophic b/l. No open wounds. No interdigital macerations.   Toenails 1-5 b/l thick, discolored, elongated with subungual debris and pain on dorsal palpation.   Hyperkeratotic lesion(s) medial IPJ of left great toe, medial IPJ of right great toe, medial PIPJ of both feet, and submet head 2  left foot.  No erythema, no edema, no drainage, no fluctuance.  Musculoskeletal Examination: Muscle strength 5/5 to all lower extremity muscle groups bilaterally. HAV with bunion bilaterally and hammertoes 2-5 b/l.SABRA No pain, crepitus or joint limitation noted with ROM b/l LE.  Patient ambulates independently without assistive aids.  Radiographs: None  Assessment/Plan: 1. Pain due to onychomycosis of toenails of both feet   2. Corns and callosities   3. PVD (peripheral vascular disease) (HCC)   4. Neuropathy of left foot   Patient was evaluated and treated. All patient's and/or POA's questions/concerns addressed on today's visit. Mycotic toenails 1-5 debrided in length and girth without incident. Corn(s)/Callus(es) medial IPJ of left great toe, medial IPJ of right great toe, medial PIPJ of bilateral 2nd toes, and submet head 2 left foot pared with sharp debridement without incident by assistant Andrez Manchester under my supervision. Continue daily foot inspections and monitor blood glucose per PCP/Endocrinologist's recommendations.Continue soft, supportive shoe gear daily. Report any pedal injuries to medical professional. Call office if there are any questions/concerns. -Patient/POA to call should there be question/concern in the interim.   Return in about 9 weeks (around 06/06/2024).  Delon LITTIE Merlin, DPM      [place] LOCATION: [address] [place] [address]                   Office [phone]  [place] LOCATION: [address] [place], [place] [address] Office [phone]

## 2024-06-06 ENCOUNTER — Ambulatory Visit: Admitting: Podiatry

## 2024-06-06 DIAGNOSIS — B351 Tinea unguium: Secondary | ICD-10-CM

## 2024-06-06 DIAGNOSIS — I739 Peripheral vascular disease, unspecified: Secondary | ICD-10-CM | POA: Diagnosis not present

## 2024-06-06 DIAGNOSIS — M79675 Pain in left toe(s): Secondary | ICD-10-CM | POA: Diagnosis not present

## 2024-06-06 DIAGNOSIS — G5792 Unspecified mononeuropathy of left lower limb: Secondary | ICD-10-CM | POA: Diagnosis not present

## 2024-06-06 DIAGNOSIS — L84 Corns and callosities: Secondary | ICD-10-CM | POA: Diagnosis not present

## 2024-06-06 DIAGNOSIS — M79674 Pain in right toe(s): Secondary | ICD-10-CM

## 2024-06-11 ENCOUNTER — Encounter: Payer: Self-pay | Admitting: Podiatry

## 2024-06-11 NOTE — Progress Notes (Signed)
  Subjective:  Patient ID: Amy Richards, female    DOB: 1946/06/28,  MRN: [id number]  [name] presents to clinic today for at risk foot care. Patient has h/o PAD and neuropathy and corn(s) of both feet, callus(es) of both feet and painful elongated, discolored, dystrophic nails.  Pain interferes with ambulation. Aggravating factors include wearing enclosed shoe gear. Painful toenails interfere with ambulation. Aggravating factors include wearing enclosed shoe gear. Pain is relieved with periodic professional debridement. Painful corns and calluses are aggravated when weightbearing with and without shoegear. Pain is relieved with periodic professional debridement.  Chief Complaint  Patient presents with   Nail Problem    Thick painful toenails, 9 week follow up    New problem(s): None.   PCP is Jolee Madelin Patch, MD. ARNETTA 06/11/2023.  No Known Allergies  Review of Systems: Negative except as noted in the HPI.  Objective: No changes noted in today's physical examination. There were no vitals filed for this visit. Amy Richards is a pleasant 78 y.o. female WD, WN in NAD. AAO x 3.  Vascular Examination: CFT less than 3 seconds. DP pulses 2/4 b/l. PT pulses 0/4  b/l. Digital hair absent. Skin temperature gradient warm to warn b/l. No ischemia or gangrene. No cyanosis or clubbing noted b/l. No edema noted b/l LE.   Neurological Examination: Pt has subjective symptoms of neuropathy. Protective sensation intact with 10 gram monofilament right foot. Protective sensation diminished with 10 gram monofilament left foot. Dropfoot noted left foot.  Dermatological Examination: Pedal skin thin, shiny and atrophic b/l. No open wounds. No interdigital macerations.   Toenails 1-5 b/l thick, discolored, elongated with subungual debris and pain on dorsal palpation.   Hyperkeratotic lesion(s) medial IPJ of left great toe, medial IPJ of right great toe, medial PIPJ of b/l 2nd digits of  both feet, and submet head 2 left foot.  No erythema, no edema, no drainage, no fluctuance.  Musculoskeletal Examination: Muscle strength 5/5 to all lower extremity muscle groups bilaterally. HAV with bunion bilaterally and hammertoes 2-5 b/l.SABRA No pain, crepitus or joint limitation noted with ROM b/l LE.  Patient ambulates independently without assistive aids.  Radiographs: None  Assessment/Plan: 1. Pain due to onychomycosis of toenails of both feet   2. Corns and callosities   3. Neuropathy of left foot   4. PVD (peripheral vascular disease) (HCC)   Consent given for treatment. Patient examined. All patient's and/or POA's questions/concerns addressed on today's visit. Toenails 1-5 debrided in length and girth without incident. Corn(s) and callus(es) medial IPJ of bilateral great toes, medial PIPJ of bilateral 2nd toes, and submet head 2 left foot pared with sharp debridement without incident. Continue soft, supportive shoe gear daily. Report any pedal injuries to medical professional. Call office if there are any questions/concerns.  Return in about 9 weeks (around 08/08/2024).  Delon LITTIE Merlin, DPM      [place] LOCATION: [address] [place] [address]                   Office [phone]   [facility] LOCATION: [address] [place], [place] [address] Office [phone]

## 2024-08-08 ENCOUNTER — Encounter: Payer: Self-pay | Admitting: Podiatry

## 2024-08-08 ENCOUNTER — Ambulatory Visit: Admitting: Podiatry

## 2024-08-08 DIAGNOSIS — G5792 Unspecified mononeuropathy of left lower limb: Secondary | ICD-10-CM | POA: Diagnosis not present

## 2024-08-08 DIAGNOSIS — L84 Corns and callosities: Secondary | ICD-10-CM

## 2024-08-08 DIAGNOSIS — M79675 Pain in left toe(s): Secondary | ICD-10-CM | POA: Diagnosis not present

## 2024-08-08 DIAGNOSIS — B351 Tinea unguium: Secondary | ICD-10-CM | POA: Diagnosis not present

## 2024-08-08 DIAGNOSIS — I739 Peripheral vascular disease, unspecified: Secondary | ICD-10-CM | POA: Diagnosis not present

## 2024-08-08 DIAGNOSIS — M79674 Pain in right toe(s): Secondary | ICD-10-CM

## 2024-08-16 NOTE — Progress Notes (Unsigned)
  Subjective:  Patient ID: Amy Richards, female    DOB: 1946-09-19,  MRN: [id number]  Amy Richards presents to clinic today for {jgcomplaint:23593}  Chief Complaint  Patient presents with   Nail Problem    Thick painful toenails, 9 week follow up    New problem(s): None.   PCP is Jolee Madelin Patch, MD.  No Known Allergies  Review of Systems: Negative except as noted in the HPI.  Objective: No changes noted in today's physical examination. There were no vitals filed for this visit. Amy Richards is a pleasant 78 y.o. female WD, WN in NAD. AAO x 3.  Vascular Examination: CFT less than 3 seconds. DP pulses 2/4 b/l. PT pulses 0/4  b/l. Digital hair absent. Skin temperature gradient warm to warn b/l. No ischemia or gangrene. No cyanosis or clubbing noted b/l. No edema noted b/l LE.   Neurological Examination: Pt has subjective symptoms of neuropathy. Protective sensation intact with 10 gram monofilament right foot. Protective sensation diminished with 10 gram monofilament left foot. Dropfoot noted left foot.  Dermatological Examination: Pedal skin thin, shiny and atrophic b/l. No open wounds. No interdigital macerations.   Toenails 1-5 b/l thick, discolored, elongated with subungual debris and pain on dorsal palpation.   Hyperkeratotic lesion(s) medial IPJ of left great toe, medial IPJ of right great toe, medial PIPJ of b/l 2nd digits of both feet, and submet head 2 left foot.  No erythema, no edema, no drainage, no fluctuance.  Musculoskeletal Examination: Muscle strength 5/5 to all lower extremity muscle groups bilaterally. HAV with bunion bilaterally and hammertoes 2-5 b/l.SABRA No pain, crepitus or joint limitation noted with ROM b/l LE.  Patient ambulates independently without assistive aids.  Radiographs: None  Assessment/Plan: No diagnosis found.  {Jgplan:23602::-Patient/POA to call should there be question/concern in the interim.}   No follow-ups on  file.  Amy Richards, DPM      [place] LOCATION: [address] [place] [address]                   Office [phone]   [facility] LOCATION: [address] [place], [place] [address] Office [phone]

## 2024-10-10 ENCOUNTER — Encounter: Payer: Self-pay | Admitting: Podiatry

## 2024-10-10 ENCOUNTER — Ambulatory Visit: Admitting: Podiatry

## 2024-10-10 DIAGNOSIS — M79674 Pain in right toe(s): Secondary | ICD-10-CM

## 2024-10-10 DIAGNOSIS — L84 Corns and callosities: Secondary | ICD-10-CM

## 2024-10-10 DIAGNOSIS — B351 Tinea unguium: Secondary | ICD-10-CM

## 2024-10-10 DIAGNOSIS — G5792 Unspecified mononeuropathy of left lower limb: Secondary | ICD-10-CM

## 2024-10-10 DIAGNOSIS — M79675 Pain in left toe(s): Secondary | ICD-10-CM

## 2024-10-16 NOTE — Progress Notes (Signed)
 "  Subjective:  Patient ID: Amy Richards, female    DOB: August 07, 1946,  MRN: [id number]  Amy Richards presents to clinic today for at risk foot care. Patient has h/o PAD and neuropathy and corn(s) b/l feet, callus(es) b/l feet and painful elongated, discolored, dystrophic nails.  Pain interferes with ambulation. Aggravating factors include wearing enclosed shoe gear. Painful toenails interfere with ambulation. Aggravating factors include wearing enclosed shoe gear. Pain is relieved with periodic professional debridement. Painful corns and calluses are aggravated when weightbearing with and without shoegear. Pain is relieved with periodic professional debridement. She is requesting new AFO device for her dropfoot LLE. Chief Complaint  Patient presents with   RFC     RFC Non diabetic toenail trim. LOV with PCP 06/21/24.   New problem(s): None.   PCP is Jolee Madelin Patch, MD.  Allergies[1]  Review of Systems: Negative except as noted in the HPI.  Objective: No changes noted in today's physical examination. There were no vitals filed for this visit. Amy Richards is a pleasant 79 y.o. female WD, WN in NAD. AAO x 3.  Vascular Examination: CFT less than 3 seconds. DP pulses 2/4 b/l. PT pulses 0/4  b/l. Digital hair absent. Skin temperature gradient warm to warn b/l. No ischemia or gangrene. No cyanosis or clubbing noted b/l. No edema noted b/l LE.   Neurological Examination: Pt has subjective symptoms of neuropathy. Protective sensation intact with 10 gram monofilament right foot. Protective sensation diminished with 10 gram monofilament left foot. Dropfoot noted left foot.  Dermatological Examination: Pedal skin thin, shiny and atrophic b/l. No open wounds. No interdigital macerations.   Toenails 1-5 b/l thick, discolored, elongated with subungual debris and pain on dorsal palpation.   Hyperkeratotic lesion(s) medial IPJ of left great toe, medial IPJ of right great toe, medial  PIPJ of b/l 2nd digits of both feet, and submet head 2 left foot.  No erythema, no edema, no drainage, no fluctuance.  Musculoskeletal Examination: Muscle strength 5/5 to all lower extremity muscle groups LLE. Dropfoot LLE. AV with bunion bilaterally and hammertoes 2-5 b/l.SABRA No pain, crepitus or joint limitation noted with ROM b/l LE.  Patient ambulates independently without assistive aids.  Radiographs: None  Assessment/Plan: 1. Pain due to onychomycosis of toenails of both feet   2. Corns and callosities   3. Neuropathy of left foot   Patient was evaluated and treated. All patient's and/or POA's questions/concerns addressed on today's visit. Mycotic toenails 1-5 b/l debrided in length and girth without incident. Corn(s)/Callus(es) medial IPJ of left great toe, medial IPJ of right great toe, medial PIPJ of bilateral 2nd toes, and submet head 2 left foot pared with sharp debridement without incident. Continue soft, supportive shoe gear daily. Report any pedal injuries to medical professional. Call office if there are any questions/concerns. Advised her to see Dr. Silva for new DME device for dropfoot LLE. -Patient/POA to call should there be question/concern in the interim.   Return in about 9 weeks (around 12/12/2024).  Delon LITTIE Merlin, DPM      [place] LOCATION: [address] [place] [place], [place] [address]  Office [phone]   [facility] LOCATION: [address] [place], [place] [address] Office [phone]     [1] No Known Allergies  "

## 2024-10-17 ENCOUNTER — Ambulatory Visit: Admitting: Podiatry

## 2025-01-03 ENCOUNTER — Ambulatory Visit: Admitting: Podiatry

## 2025-03-07 ENCOUNTER — Ambulatory Visit: Admitting: Podiatry
